# Patient Record
Sex: Male | Born: 1990 | Race: Black or African American | Hispanic: No | Marital: Single
Health system: Southern US, Community
[De-identification: ages and names within clinical notes are randomized; demographics above are authoritative.]

## PROBLEM LIST (undated history)

## (undated) DIAGNOSIS — R569 Unspecified convulsions: Secondary | ICD-10-CM

## (undated) DIAGNOSIS — W3400XA Accidental discharge from unspecified firearms or gun, initial encounter: Secondary | ICD-10-CM

## (undated) DIAGNOSIS — G43909 Migraine, unspecified, not intractable, without status migrainosus: Secondary | ICD-10-CM

---
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
## 2012-04-08 ENCOUNTER — Encounter: Payer: Medicaid Other | Admitting: Family Medicine

## 2012-07-17 ENCOUNTER — Ambulatory Visit (INDEPENDENT_AMBULATORY_CARE_PROVIDER_SITE_OTHER): Payer: Medicaid Other | Admitting: Family Medicine

## 2012-07-17 ENCOUNTER — Encounter: Payer: Self-pay | Admitting: Family Medicine

## 2012-07-17 VITALS — HR 59 | Temp 98.2°F | Ht 73.0 in | Wt 166.3 lb

## 2012-07-17 DIAGNOSIS — R569 Unspecified convulsions: Secondary | ICD-10-CM

## 2012-07-17 DIAGNOSIS — Z23 Encounter for immunization: Secondary | ICD-10-CM

## 2012-07-17 NOTE — Patient Instructions (Signed)
Human Papillomavirus HPV stands for human papillomavirus. There are many different types of HPV. People of all ages and races can get an HPV infection. In females, some types of HPV can cause warts on the sex organs or anus. Some females never see any warts on the outside, yet still have the infection inside. The doctor will need to check the sex organs and do a Pap test to see there is an HPV infection. The only sign of infection may be a Pap test that is abnormal. A male who has HPV has a higher chance of getting cancer of the sex organs or anus. It is very rare, but some females can give their babies the HPV infection when the baby is being born. Some of these babies can grow warts on the voice box (vocal cords). Males with HPV have a higher chance of getting cancer of the penis or anus. A male may have HPV but not see any warts on his penis or anus. There is no test to find HPV in males, so even if warts are not seen, there may still be an infection. HOME CARE   Take medicines as told by your doctor.  Get needed Pap tests.  Keep follow-up exams.  Do not touch or scratch the warts.  Do not treat warts with medicines used for treating hand warts.  Tell your sex partner about your infection because he or she may also need treatment.  Do not have sex while you are getting treatment.  After treatment, use condoms during sex.  Use over-the-counter creams for itching as told by your doctor.  Use over-the-counter or prescription medicines for pain, discomfort or fever as told by your doctor.  Do not douche or use tampons during treatment of HPV. GET HELP RIGHT AWAY IF:  You have a temperature by mouth above 102 F (38.9 C), not controlled by medicine. MAKE SURE YOU:   Understand these instructions.  Will watch your condition.  Will get help right away if you are not doing well or get worse. Document Released: 06/28/2008 Document Revised: 10/08/2011 Document Reviewed:  06/28/2008 [facility] Patient Information 2013 [place], [place].

## 2012-07-17 NOTE — Progress Notes (Signed)
Subjective:     Patient ID: Bruce Shields, male   DOB: 1990-09-26, 21 y.o.   MRN: [id number]  HPI Seizure D/O: Patient has a known seizure disorder since he was [age] years old. He has been followed by Dr. Benna Dunks and would like to re-establish with him. He has not seen his neurologist in over 3 years. He has already spoke with the office and they will see him again with a referral. He has been seizure free in over three years. He seems to have been non-compliant with medications over the past few years stating he was attempting to "wean" himself of them. Since May 2013 he has begun to have migraine headaches and this concerns him that he will have a seizure again. He endorses photophobia, nausea and occasionally vomit with his migraines.   TdAP: His daughter had pertussis and he was treated once for it. He is interested in receiving the Tdap today since it has been over ten years since he has had a tetanus shot. He is also interested in scheduling the HPV vaccination on his next appointment.    Review of Systems  All other systems reviewed and are negative.       Objective:   Physical Exam  Constitutional: He is oriented to person, place, and time. He appears well-developed and well-nourished. No distress.  HENT:  Head: Normocephalic and atraumatic.  Right Ear: External ear normal.  Left Ear: External ear normal.  Nose: Nose normal.  Mouth/Throat: No oropharyngeal exudate.  Eyes: Conjunctivae normal and EOM are normal. Pupils are equal, round, and reactive to light. Right eye exhibits no discharge. Left eye exhibits no discharge. No scleral icterus.  Cardiovascular: Normal rate, regular rhythm and normal heart sounds.   No murmur heard. Pulmonary/Chest: Effort normal and breath sounds normal. No respiratory distress. He has no wheezes. He has no rales.  Abdominal: Soft. Bowel sounds are normal. He exhibits no distension and no mass. There is no tenderness. There is no rebound and no  guarding.  Musculoskeletal: He exhibits no edema and no tenderness.  Neurological: He is alert and oriented to person, place, and time.  Skin: Skin is warm and dry.

## 2012-07-21 ENCOUNTER — Other Ambulatory Visit (HOSPITAL_COMMUNITY): Payer: Self-pay | Admitting: Pediatrics

## 2012-07-21 DIAGNOSIS — R569 Unspecified convulsions: Secondary | ICD-10-CM

## 2012-07-21 MED ORDER — TETANUS-DIPHTH-ACELL PERTUSSIS 5-2.5-18.5 LF-MCG/0.5 IM SUSP: 0.5000 mL | Freq: Once | INTRAMUSCULAR | Status: AC

## 2012-07-21 NOTE — Addendum Note (Signed)
Addended by: Altamese Dilling A on: 07/21/2012 04:00 PM   Modules accepted: Orders

## 2012-08-06 ENCOUNTER — Ambulatory Visit (HOSPITAL_COMMUNITY)
Admission: RE | Admit: 2012-08-06 | Discharge: 2012-08-06 | Disposition: A | Discharge status: Home / Self Care | Payer: Medicaid Other | Source: Ambulatory Visit | Attending: Pediatrics | Admitting: Pediatrics

## 2012-08-06 DIAGNOSIS — R569 Unspecified convulsions: Secondary | ICD-10-CM

## 2012-08-06 DIAGNOSIS — G40909 Epilepsy, unspecified, not intractable, without status epilepticus: Secondary | ICD-10-CM | POA: Insufficient documentation

## 2012-08-06 NOTE — Progress Notes (Signed)
EEG completed.

## 2012-08-07 NOTE — Procedures (Signed)
EEG NUMBER:  [id number].  CLINICAL HISTORY:  This is a 22 year old male with history of seizure disorder since age 74, who has been seizure free for the past 3 years. The patient has been noncompliant with his medication and would like to wean himself off of medication.  EEG was done to evaluate for seizure.  MEDICATIONS:  None at this time.  PROCEDURE:  The tracing was carried out on a 32-channel digital Cadwell recorder reformatted into 16-channel montages with 1 devoted to EKG. The 10/20 international system electrode placement was used.  Recording was done during awake and sleep.  DESCRIPTION OF FINDINGS:  During awake state, background rhythm consisted of amplitude of 38 microvolts and frequency of 10 hertz, posterior dominant rhythm.  There was normal anterior-posterior gradient noted.  Background was continuous and symmetric with no focal slowing. During drowsiness and sleep, there was gradual replacement of alpha rhythm with upper theta activity.  During earlier stage of sleep, there were a few vertex sharp waves noted, but no sleep spindles appreciated. Hyperventilation resulted in no significant slowing of the background activity.  Photic stimulation using a step-wise increase in photic frequency resulted in bilateral driving response in the lower frequency of photic stimulation.  Throughout the tracing, there were no focal or generalized epileptiform activities in the form of spikes or sharps noted.  There were no transient rhythmic activities or electrographic seizures noted.  One-lead EKG rhythm strip revealed sinus rhythm with a rate of 48 beats per minute.  IMPRESSION:  This EEG is normal during awake and sleep state.  Please note that a normal EEG does not exclude epilepsy.  Clinical correlation is indicated.  Of note, there was relative bradycardia throughout the tracing.          ______________________________             Keturah Shavers, MD    [id number] D:   08/06/2012 18:02:36  T:  08/07/2012 02:56:33  Job #:  [id number]

## 2012-08-11 ENCOUNTER — Encounter (HOSPITAL_COMMUNITY): Payer: Self-pay

## 2012-08-11 ENCOUNTER — Emergency Department (HOSPITAL_COMMUNITY)
Admission: EM | Admit: 2012-08-11 | Discharge: 2012-08-11 | Disposition: A | Discharge status: Home / Self Care | Payer: Medicaid Other | Attending: Emergency Medicine | Admitting: Emergency Medicine

## 2012-08-11 DIAGNOSIS — R05 Cough: Secondary | ICD-10-CM | POA: Insufficient documentation

## 2012-08-11 DIAGNOSIS — Z8679 Personal history of other diseases of the circulatory system: Secondary | ICD-10-CM | POA: Insufficient documentation

## 2012-08-11 DIAGNOSIS — H9209 Otalgia, unspecified ear: Secondary | ICD-10-CM | POA: Insufficient documentation

## 2012-08-11 DIAGNOSIS — J029 Acute pharyngitis, unspecified: Secondary | ICD-10-CM | POA: Insufficient documentation

## 2012-08-11 DIAGNOSIS — R059 Cough, unspecified: Secondary | ICD-10-CM | POA: Insufficient documentation

## 2012-08-11 DIAGNOSIS — IMO0001 Reserved for inherently not codable concepts without codable children: Secondary | ICD-10-CM | POA: Insufficient documentation

## 2012-08-11 DIAGNOSIS — Z8669 Personal history of other diseases of the nervous system and sense organs: Secondary | ICD-10-CM | POA: Insufficient documentation

## 2012-08-11 MED ORDER — HYDROCODONE-ACETAMINOPHEN 5-325 MG PO TABS: 2.0000 | ORAL_TABLET | ORAL | Status: DC | PRN

## 2012-08-11 MED ORDER — HYDROCODONE-ACETAMINOPHEN 5-325 MG PO TABS
2.0000 | ORAL_TABLET | Freq: Once | ORAL | Status: AC
  Administered 2012-08-11: 2 via ORAL
  Filled 2012-08-11: qty 2

## 2012-08-11 NOTE — ED Provider Notes (Signed)
History   This chart was scribed for Doug Sou, MD by Gerlean Ren, ED Scribe. This patient was seen in room TR06C/TR06C and the patient's care was started at 9:56 PM    CSN: [id number]  Arrival date & time 08/11/12  2110   First MD Initiated Contact with Patient 08/11/12 2155      Chief Complaint  Patient presents with  . Sore Throat  . Generalized Body Aches     The history is provided by the patient. No language interpreter was used.   Bruce Shields is a 22 y.o. male with no chronic medical conditions who presents to the Emergency Department complaining of 3 days of constant, gradually worsening sore throat worsened by swallowing causing decreased food and fluid intake with associated cough, right side otalgia, and myalgias.  Mucinex taken with no improvements to any symptoms.  Pt reports occasional tobacco use and denies alcohol use. Past Medical History  Diagnosis Date  . Migraine   . Seizure     History reviewed. No pertinent past surgical history.  History reviewed. No pertinent family history.  History  Substance Use Topics  . Smoking status: Never Smoker   . Smokeless tobacco: Not on file  . Alcohol Use: No      Review of Systems  Constitutional: Negative.   HENT: Positive for ear pain and sore throat.   Respiratory: Positive for cough.   Cardiovascular: Negative.   Gastrointestinal: Negative.   Musculoskeletal: Positive for myalgias.  Skin: Negative.   Neurological: Negative.   Hematological: Negative.   Psychiatric/Behavioral: Negative.     Allergies  Review of patient's allergies indicates no known allergies.  Home Medications  No current outpatient prescriptions on file.  BP 158/81  Pulse 77  Temp 98.4 F (36.9 C) (Oral)  Resp 16  SpO2 98%  Physical Exam  Nursing note and vitals reviewed. Constitutional: He appears well-developed and well-nourished.  HENT:  Head: Normocephalic and atraumatic.       Uvula midline, tonsils enlarged  and red.  Eyes: Conjunctivae normal are normal. Pupils are equal, round, and reactive to light.  Neck: Neck supple. No tracheal deviation present. No thyromegaly present.       Tender cervical lymphadenopathy.  Cardiovascular: Normal rate and regular rhythm.   No murmur heard. Pulmonary/Chest: Effort normal and breath sounds normal.  Abdominal: Soft. Bowel sounds are normal. He exhibits no distension. There is no tenderness.  Musculoskeletal: Normal range of motion. He exhibits no edema and no tenderness.  Neurological: He is alert. Coordination normal.  Skin: Skin is warm and dry. No rash noted.  Psychiatric: He has a normal mood and affect.    ED Course  Procedures (including critical care time) DIAGNOSTIC STUDIES: Oxygen Saturation is 98% on room air, normal by my interpretation.    COORDINATION OF CARE: 9:59 PM- Patient informed of clinical course, understands medical decision-making process, and agrees with plan.   Results for orders placed during the hospital encounter of 08/11/12  RAPID STREP SCREEN      Component Value Range   Streptococcus, Group A Screen (Direct) NEGATIVE  NEGATIVE     No diagnosis found.   11 PM feels improved after treatment with Norco. Patient having secretions well, in no distress MDM  Plan prescription Norco encourage oral hydration. Blood pressure recheck 3 weeks  Diagnosis #1 pharyngitis #2 elevated blood pressure I personally performed the services described in this documentation, which was scribed in my presence. The recorded information has been reviewed  and is accurate.         Doug Sou, MD 08/11/12 [phone]

## 2012-08-11 NOTE — ED Notes (Signed)
Dr. Jacubowitz at bedside 

## 2012-08-11 NOTE — ED Notes (Signed)
Pt appears to be restless and c/o generalized body pain. Pt present with red, swollen throat and white coating on tonsils. Pt mentating appropriately. Pt denies nausea/vomiting/diarrhea. Pt states dizziness and lightheadedness present.

## 2012-08-11 NOTE — ED Notes (Addendum)
Pt's throat is red, swollen, and has white pockets present. Increase swelling to (R) side

## 2012-08-11 NOTE — ED Notes (Signed)
Pt states he ha da cough earlier but took mucinex and now cough is gone.

## 2012-08-11 NOTE — ED Notes (Signed)
Pt reports throat pain, lower back pain, headache and (R) ear aches x3 days. Pt reports increase pain w/swallowing. Pt restless in triage

## 2012-08-13 ENCOUNTER — Encounter (HOSPITAL_COMMUNITY): Payer: Self-pay | Admitting: Emergency Medicine

## 2012-08-13 ENCOUNTER — Emergency Department (HOSPITAL_COMMUNITY)
Admission: EM | Admit: 2012-08-13 | Discharge: 2012-08-13 | Disposition: A | Discharge status: Home / Self Care | Payer: Medicaid Other | Attending: Emergency Medicine | Admitting: Emergency Medicine

## 2012-08-13 DIAGNOSIS — Z79899 Other long term (current) drug therapy: Secondary | ICD-10-CM | POA: Insufficient documentation

## 2012-08-13 DIAGNOSIS — J36 Peritonsillar abscess: Secondary | ICD-10-CM

## 2012-08-13 DIAGNOSIS — Z8679 Personal history of other diseases of the circulatory system: Secondary | ICD-10-CM | POA: Insufficient documentation

## 2012-08-13 DIAGNOSIS — R52 Pain, unspecified: Secondary | ICD-10-CM | POA: Insufficient documentation

## 2012-08-13 DIAGNOSIS — R05 Cough: Secondary | ICD-10-CM | POA: Insufficient documentation

## 2012-08-13 DIAGNOSIS — R059 Cough, unspecified: Secondary | ICD-10-CM | POA: Insufficient documentation

## 2012-08-13 DIAGNOSIS — H9209 Otalgia, unspecified ear: Secondary | ICD-10-CM | POA: Insufficient documentation

## 2012-08-13 DIAGNOSIS — Z8669 Personal history of other diseases of the nervous system and sense organs: Secondary | ICD-10-CM | POA: Insufficient documentation

## 2012-08-13 LAB — BASIC METABOLIC PANEL
BUN: 9 mg/dL (ref 6–23)
CO2: 24 mEq/L (ref 19–32)
Calcium: 10.4 mg/dL (ref 8.4–10.5)
Creatinine, Ser: 0.77 mg/dL (ref 0.50–1.35)

## 2012-08-13 LAB — CBC WITH DIFFERENTIAL/PLATELET
Basophils Absolute: 0 10*3/uL (ref 0.0–0.1)
Basophils Relative: 0 % (ref 0–1)
Eosinophils Relative: 0 % (ref 0–5)
HCT: 46.5 % (ref 39.0–52.0)
Lymphocytes Relative: 8 % — ABNORMAL LOW (ref 12–46)
MCHC: 35.1 g/dL (ref 30.0–36.0)
MCV: 91.2 fL (ref 78.0–100.0)
Monocytes Absolute: 2.6 10*3/uL — ABNORMAL HIGH (ref 0.1–1.0)
Monocytes Relative: 17 % — ABNORMAL HIGH (ref 3–12)
RDW: 12.3 % (ref 11.5–15.5)

## 2012-08-13 MED ORDER — SODIUM CHLORIDE 0.9 % IV SOLN
1000.0000 mL | INTRAVENOUS | Status: DC
  Administered 2012-08-13 (×2): 1000 mL via INTRAVENOUS

## 2012-08-13 MED ORDER — SODIUM CHLORIDE 0.9 % IV SOLN
1000.0000 mL | Freq: Once | INTRAVENOUS | Status: AC
  Administered 2012-08-13: 1000 mL via INTRAVENOUS

## 2012-08-13 MED ORDER — CLINDAMYCIN HCL 300 MG PO CAPS
300.0000 mg | ORAL_CAPSULE | Freq: Four times a day (QID) | ORAL | Status: DC
Start: 2012-08-13 — End: 2013-03-11

## 2012-08-13 MED ORDER — CLINDAMYCIN PHOSPHATE 600 MG/50ML IV SOLN
600.0000 mg | Freq: Once | INTRAVENOUS | Status: AC
  Administered 2012-08-13: 600 mg via INTRAVENOUS
  Filled 2012-08-13: qty 50

## 2012-08-13 MED ORDER — HYDROMORPHONE HCL PF 1 MG/ML IJ SOLN
1.0000 mg | INTRAMUSCULAR | Status: DC | PRN
  Administered 2012-08-13 (×2): 1 mg via INTRAVENOUS
  Filled 2012-08-13 (×2): qty 1

## 2012-08-13 MED ORDER — HYDROCODONE-ACETAMINOPHEN 5-325 MG PO TABS: 1.0000 | ORAL_TABLET | Freq: Four times a day (QID) | ORAL | Status: DC | PRN

## 2012-08-13 MED ORDER — SODIUM CHLORIDE 0.9 % IV SOLN: 1000.0000 mL | Freq: Once | INTRAVENOUS | Status: DC

## 2012-08-13 NOTE — ED Notes (Addendum)
Pt states that he has had sore throat, cough, and right ear pain since Sunday.  Pt is holding a wash cloth over his mouth while talking because of "drool" because it "hurts to swallow".  Pt will not remove it to talk to me.  When asked if he is having trouble breathing, pt states yes.  Pt's throat is severely swollen.

## 2012-08-13 NOTE — ED Provider Notes (Signed)
History     CSN: [id number] Arrival date & time 08/13/12  4098 First MD Initiated Contact with Patient 08/13/12 [phone]      Chief Complaint  Patient presents with  . Sore Throat  . Otalgia  . Cough   HPI The patient presents to the emergency room with complaints of a sore throat.  He states the symptoms started on Saturday. He has had some slight cough and body aches associated with a sore throat. He was initially seen in the emergency department on January 13. He had a strep test that was negative and discharged home with supportive care. The symptoms have progressed since that time. Patient is now having increasing pain in his right throat and right ear. Feels like he is having difficulty breathing, swallowing and eating because of the severe pain. The patient now states it's even hard for him to swallow his own saliva.  He denies any chest pain. He denies any recent injuries. Past Medical History  Diagnosis Date  . Migraine   . Seizure     History reviewed. No pertinent past surgical history.  History reviewed. No pertinent family history.  History  Substance Use Topics  . Smoking status: Never Smoker   . Smokeless tobacco: Not on file  . Alcohol Use: No      Review of Systems  All other systems reviewed and are negative.    Allergies  Review of patient's allergies indicates no known allergies.  Home Medications   Current Outpatient Rx  Name  Route  Sig  Dispense  Refill  . GUAIFENESIN ER 600 MG PO TB12   Oral   Take 1,200 mg by mouth 2 (two) times daily.         Marland Kitchen HYDROCODONE-ACETAMINOPHEN 5-325 MG PO TABS   Oral   Take 2 tablets by mouth every 4 (four) hours as needed for pain.   12 tablet   0     BP 127/98  Pulse 100  Temp 98.2 F (36.8 C) (Oral)  Resp 18  SpO2 99%  Physical Exam  Nursing note and vitals reviewed. Constitutional: He appears well-developed and well-nourished. No distress.  HENT:  Head: Normocephalic and atraumatic.  Right  Ear: External ear normal.  Left Ear: External ear normal.  Mouth/Throat: Mucous membranes are dry. No oral lesions. Uvula swelling present. Oropharyngeal exudate, posterior oropharyngeal edema, posterior oropharyngeal erythema and tonsillar abscesses (uvula is deviating towards the left, asymmetrical swelling on the right) present.       His voice is hoarse  Eyes: Conjunctivae normal are normal. Right eye exhibits no discharge. Left eye exhibits no discharge. No scleral icterus.  Neck: Neck supple. No tracheal deviation present.       Course voice  Cardiovascular: Normal rate, regular rhythm and intact distal pulses.   Pulmonary/Chest: Effort normal and breath sounds normal. No stridor. No respiratory distress. He has no wheezes. He has no rales.  Abdominal: Soft. Bowel sounds are normal. He exhibits no distension. There is no tenderness. There is no rebound and no guarding.  Musculoskeletal: He exhibits no edema and no tenderness.  Lymphadenopathy:    He has cervical adenopathy.  Neurological: He is alert. He has normal strength. No sensory deficit. Cranial nerve deficit:  no gross defecits noted. He exhibits normal muscle tone. He displays no seizure activity. Coordination normal.  Skin: Skin is warm and dry. No rash noted.  Psychiatric: He has a normal mood and affect.    ED Course  Procedures (including  critical care time) ED Medication Orders          Start      Status  Ordering Provider     08/13/12 0830    0.9 % sodium chloride infusion Once  Last MAR action: Stopped  Calem Cocozza R        Route: Intravenous Ordered Dose: 1,000 mL                          08/13/12 0830    0.9 % sodium chloride infusion Once  Acknowledged  Treyce Spillers R        Route: Intravenous Ordered Dose: 1,000 mL                          08/13/12 0830    0.9 % sodium chloride infusion Continuous  Last MAR action: New Bag/Given  Rifky Lapre R        Route: Intravenous Ordered Dose: 1,000 mL                           08/13/12 0830    clindamycin (CLEOCIN) IVPB 600 mg Once  Last MAR action: Stopped  Hamed Debella R        Route: Intravenous Ordered Dose: 600 mg                  08/13/12 0820    HYDROmorphone (DILAUDID) injection 1 mg Every 30 min PRN           Route: Intravenous Ordered Dose: 1 mg        Labs Reviewed  CBC WITH DIFFERENTIAL - Abnormal; Notable for the following:    WBC 15.7 (*)     Neutro Abs 11.8 (*)     Lymphocytes Relative 8 (*)     Monocytes Relative 17 (*)     Monocytes Absolute 2.6 (*)     All other components within normal limits  BASIC METABOLIC PANEL - Abnormal; Notable for the following:    Sodium 133 (*)     Chloride 95 (*)     Glucose, Bld 114 (*)     All other components within normal limits   No results found.   1. Peritonsillar abscess       MDM  Patient's exam is consistent with a peritonsillar abscess.  He was hydrated, given antibiotics, and provided pain medications in the emergency department. I consulted with Dr. Deetta Perla, ENT.  He would prefer to see the patient in his office this afternoon for treatment of the peritonsillar abscess as he is in the OR this morning.Marland Kitchen He'll be able to perform the incision and drainage there.  I spoke with the patient and his mother. He will be discharged from the emergency room to followup there.  He is comfortable with that plan        Celene Kras, MD 08/13/12 [phone]

## 2012-08-14 ENCOUNTER — Ambulatory Visit: Payer: Medicaid Other | Admitting: Family Medicine

## 2013-03-10 DIAGNOSIS — Z0289 Encounter for other administrative examinations: Secondary | ICD-10-CM

## 2013-03-11 ENCOUNTER — Encounter (HOSPITAL_COMMUNITY): Payer: Self-pay | Admitting: Emergency Medicine

## 2013-03-11 ENCOUNTER — Emergency Department (HOSPITAL_COMMUNITY)
Admission: EM | Admit: 2013-03-11 | Discharge: 2013-03-11 | Discharge status: Against Medical Advice | Payer: Medicaid Other | Attending: Emergency Medicine | Admitting: Emergency Medicine

## 2013-03-11 ENCOUNTER — Emergency Department (HOSPITAL_COMMUNITY)
Admission: EM | Admit: 2013-03-11 | Discharge: 2013-03-11 | Disposition: A | Discharge status: Hospice | Payer: Medicaid Other | Source: Home / Self Care | Attending: Emergency Medicine | Admitting: Emergency Medicine

## 2013-03-11 DIAGNOSIS — M549 Dorsalgia, unspecified: Secondary | ICD-10-CM

## 2013-03-11 DIAGNOSIS — R1013 Epigastric pain: Secondary | ICD-10-CM

## 2013-03-11 DIAGNOSIS — M545 Low back pain, unspecified: Secondary | ICD-10-CM | POA: Insufficient documentation

## 2013-03-11 DIAGNOSIS — R509 Fever, unspecified: Secondary | ICD-10-CM | POA: Insufficient documentation

## 2013-03-11 DIAGNOSIS — R109 Unspecified abdominal pain: Secondary | ICD-10-CM | POA: Insufficient documentation

## 2013-03-11 DIAGNOSIS — Z8679 Personal history of other diseases of the circulatory system: Secondary | ICD-10-CM | POA: Insufficient documentation

## 2013-03-11 LAB — CBC WITH DIFFERENTIAL/PLATELET
Eosinophils Absolute: 0 10*3/uL (ref 0.0–0.7)
HCT: 45.3 % (ref 39.0–52.0)
Hemoglobin: 16 g/dL (ref 13.0–17.0)
MCH: 32.5 pg (ref 26.0–34.0)
MCHC: 35.3 g/dL (ref 30.0–36.0)
Monocytes Absolute: 0.4 10*3/uL (ref 0.1–1.0)
Monocytes Relative: 6 % (ref 3–12)
RDW: 13.1 % (ref 11.5–15.5)

## 2013-03-11 LAB — COMPREHENSIVE METABOLIC PANEL
ALT: 24 U/L (ref 0–53)
AST: 30 U/L (ref 0–37)
Alkaline Phosphatase: 66 U/L (ref 39–117)
CO2: 26 mEq/L (ref 19–32)
Calcium: 10 mg/dL (ref 8.4–10.5)
GFR calc non Af Amer: >90 mL/min (ref >90)
Glucose, Bld: 87 mg/dL (ref 70–99)
Potassium: 3.6 mEq/L (ref 3.5–5.1)
Sodium: 135 mEq/L (ref 135–145)
Total Protein: 8.2 g/dL (ref 6.0–8.3)

## 2013-03-11 LAB — LIPASE, BLOOD: Lipase: 17 U/L (ref 11–59)

## 2013-03-11 LAB — POCT URINALYSIS DIP (DEVICE)
Bilirubin Urine: NEGATIVE
Glucose, UA: NEGATIVE mg/dL
Ketones, ur: NEGATIVE mg/dL
Leukocytes, UA: NEGATIVE
Protein, ur: NEGATIVE mg/dL
Specific Gravity, Urine: 1.02 (ref 1.005–1.030)

## 2013-03-11 MED ORDER — IBUPROFEN 800 MG PO TABS
800.0000 mg | ORAL_TABLET | Freq: Once | ORAL | Status: AC
  Administered 2013-03-11: 800 mg via ORAL

## 2013-03-11 MED ORDER — KETOROLAC TROMETHAMINE 30 MG/ML IJ SOLN
30.0000 mg | Freq: Once | INTRAMUSCULAR | Status: AC
  Administered 2013-03-11: 30 mg via INTRAMUSCULAR
  Filled 2013-03-11: qty 1

## 2013-03-11 MED ORDER — HYDROMORPHONE HCL PF 1 MG/ML IJ SOLN
1.0000 mg | Freq: Once | INTRAMUSCULAR | Status: DC
  Filled 2013-03-11: qty 1

## 2013-03-11 MED ORDER — IBUPROFEN 800 MG PO TABS
ORAL_TABLET | ORAL | Status: AC
  Filled 2013-03-11: qty 1

## 2013-03-11 NOTE — ED Notes (Signed)
Pt drove himself to hospital no ride home PA aware, dilaudid aware

## 2013-03-11 NOTE — ED Notes (Signed)
Lower back pain since last night denies any injury states that thye went to urgent care , they did urine it was neg , pain occ rads to abd

## 2013-03-11 NOTE — ED Provider Notes (Signed)
CSN: [id number]     Arrival date & time 03/11/13  0802 History     First MD Initiated Contact with Patient 03/11/13 [phone]     Chief Complaint  Patient presents with  . Back Pain  . Abdominal Pain   (Consider location/radiation/quality/duration/timing/severity/associated sxs/prior Treatment) HPI Comments: Patient presents urgent care complaining of sudden onset of back and abdominal pain that started suddenly this a.m. 1:00 . Patient describes the pain as extremely severe beyond 10 to starts in his back and radiates to his upper abdomen bilaterally. Patient is constantly pacing in the room moving, restless unable to find a comfortable position. Patient denies nausea vomiting or diarrheas but does describe that maybe he had a fever yesterday he is uncertain.  Patient denies any respiratory symptoms such as cough or shortness of breath denies any chest pain. Patient did not try to take anything for pain or for his stomach. Prior arrival.  Burgess Estelle he had a mild headache which he describes been having frequent migraine headaches.  Patient is a 22 y.o. male presenting with back pain and abdominal pain. The history is provided by the patient.  Back Pain Location:  Thoracic spine and lumbar spine Quality:  Aching Pain severity:  Severe Onset quality:  Sudden Timing:  Constant Progression:  Worsening Context: not recent injury and not twisting   Worsened by:  Movement Associated symptoms: abdominal pain and fever   Associated symptoms: no abdominal swelling, no bladder incontinence, no perianal numbness and no tingling   Risk factors: no recent surgery   Abdominal Pain Associated symptoms include abdominal pain. Pertinent negatives include no shortness of breath.    Past Medical History  Diagnosis Date  . Migraine   . Seizure    History reviewed. No pertinent past surgical history. History reviewed. No pertinent family history. History  Substance Use Topics  . Smoking status:  Never Smoker   . Smokeless tobacco: Not on file  . Alcohol Use: No    Review of Systems  Constitutional: Positive for fever and activity change. Negative for chills and diaphoresis.  Respiratory: Negative for cough and shortness of breath.   Gastrointestinal: Positive for abdominal pain. Negative for nausea, diarrhea, constipation, blood in stool and abdominal distention.  Genitourinary: Negative for bladder incontinence, flank pain and enuresis.  Musculoskeletal: Positive for back pain. Negative for arthralgias.  Skin: Negative for color change, pallor, rash and wound.  Neurological: Negative for dizziness and tingling.  Hematological: Does not bruise/bleed easily.    Allergies  Review of patient's allergies indicates no known allergies.  Home Medications   Current Outpatient Rx  Name  Route  Sig  Dispense  Refill  . clindamycin (CLEOCIN) 300 MG capsule   Oral   Take 1 capsule (300 mg total) by mouth every 6 (six) hours.   28 capsule   0   . guaiFENesin (MUCINEX) 600 MG 12 hr tablet   Oral   Take 1,200 mg by mouth 2 (two) times daily.         Marland Kitchen HYDROcodone-acetaminophen (NORCO) 5-325 MG per tablet   Oral   Take 1-2 tablets by mouth every 6 (six) hours as needed for pain.   16 tablet   0    BP 139/71  Pulse 88  Temp(Src) 98.3 F (36.8 C) (Oral)  Resp 18  SpO2 98% Physical Exam  Vitals reviewed. Constitutional: Vital signs are normal. He appears distressed.  Eyes: No scleral icterus.  Pulmonary/Chest: Effort normal and breath sounds normal.  Abdominal:  Normal appearance. He exhibits no distension.    Musculoskeletal:       Back:  Neurological: He is alert.  Skin: No rash noted. He is not diaphoretic. No erythema.    ED Course   Procedures (including critical care time)  Labs Reviewed  POCT URINALYSIS DIP (DEVICE)   No results found. 1. Back pain   2. Epigastric pain     MDM  22 year old male presents urgent care with severe back and abdominal  pain of sudden onset. Patient is restless,uncooperative on exam secondary to reported pain. Patient is hemodynamically stable, and afebrile at [facility].  The diagnostic differential is wide and patient is reporting severe abdominal pain will be transferred to the emergency department for further management and evaluation. Patient has been given 800 mg of ibuprofen.   Patient had no hematuria on a quick- urine dip analysis  Jimmie Molly, MD 03/11/13 [phone]

## 2013-03-11 NOTE — ED Notes (Signed)
C/o back pain which started last night around 1 am  Patient states pain radiating to stomach.  Denies vomiting.

## 2013-03-11 NOTE — ED Provider Notes (Signed)
CSN: [id number]     Arrival date & time 03/11/13  7829 History     First MD Initiated Contact with Patient 03/11/13 0857     Chief Complaint  Patient presents with  . Back Pain   (Consider location/radiation/quality/duration/timing/severity/associated sxs/prior Treatment) HPI Comments: Patient is a 22 year old male presenting to the emergency department from urgent care for acute onset constant stabbing epigastric abdominal pain and low back pain that awoke the patient from his sleep this morning at 1AM. Patient states his abdominal pain has resolved since receiving 800mg  Ibuprofen at [facility], but his back pain has not improved. Patient is still complaining of his back pain, w/o radiation. He rates his back pain 10/10 w/o alleviating factors. Patient states he is unable to find a comfortable position. He did not try any medication PTA. Pt reports subjective fever at home. Pt denies CP, SOB, nausea, vomiting, diarrhea, bladder or bowel incontinence.   Patient is a 22 y.o. male presenting with back pain.  Back Pain Associated symptoms: abdominal pain   Associated symptoms: no chest pain, no numbness and no weakness     Past Medical History  Diagnosis Date  . Migraine   . Seizure    History reviewed. No pertinent past surgical history. No family history on file. History  Substance Use Topics  . Smoking status: Never Smoker   . Smokeless tobacco: Not on file  . Alcohol Use: No    Review of Systems  Constitutional:       Subjective fever  HENT: Negative.   Eyes: Negative.   Respiratory: Negative.   Cardiovascular: Negative for chest pain, palpitations and leg swelling.  Gastrointestinal: Positive for abdominal pain. Negative for nausea, vomiting, diarrhea and constipation.  Endocrine: Negative.   Genitourinary: Negative.   Musculoskeletal: Positive for back pain.  Skin: Negative.   Neurological: Negative for dizziness, syncope, weakness, light-headedness and numbness.     Allergies  Review of patient's allergies indicates no known allergies.  Home Medications  No current outpatient prescriptions on file. BP 133/93  Pulse 77  Temp(Src) 97.9 F (36.6 C) (Oral)  Resp 18  Ht 6\' 1"  (1.854 m)  Wt 175 lb (79.379 kg)  BMI 23.09 kg/m2  SpO2 100% Physical Exam  Constitutional: He is oriented to person, place, and time. He appears well-developed and well-nourished. No distress.  Patient asleep upon my entrance into examination room  HENT:  Head: Normocephalic and atraumatic.  Mouth/Throat: Oropharynx is clear and moist.  Eyes: Conjunctivae and EOM are normal. Pupils are equal, round, and reactive to light.  Neck: Normal range of motion. Neck supple.  Cardiovascular: Normal rate, regular rhythm and normal heart sounds.   Pulmonary/Chest: Effort normal and breath sounds normal.  Abdominal: Soft. Bowel sounds are normal. He exhibits no distension. There is no tenderness. There is no rebound and no guarding.  Musculoskeletal: Normal range of motion. He exhibits no edema.  Neurological: He is alert and oriented to person, place, and time.  Skin: Skin is warm and dry. He is not diaphoretic.    ED Course   Procedures (including critical care time)  Labs Reviewed  COMPREHENSIVE METABOLIC PANEL  CBC WITH DIFFERENTIAL  LIPASE, BLOOD   No results found. 1. Abdominal  pain, other specified site   2. Back pain     MDM  Patient with back pain.  No neurological deficits and normal neuro exam.  Patient can walk but states is painful.  No loss of bowel or bladder control.  No concern  for cauda equina.  No fever, night sweats, weight loss, h/o cancer, IVDU.   Labs were obtained d/t severe nature of epigastric pain that brought patient to seek medical attention this morning despite improvement of pain. Abdomen S/NT/ND w/ good bowel sounds. Pt left AMA prior to receiving results of labs.   Jeannetta Ellis, PA-C 03/11/13 1551

## 2013-03-12 NOTE — ED Provider Notes (Signed)
Medical screening examination/treatment/procedure(s) were performed by non-physician practitioner and as supervising physician I was immediately available for consultation/collaboration.  Flint Melter, MD 03/12/13 [phone]

## 2013-03-13 ENCOUNTER — Emergency Department (HOSPITAL_COMMUNITY)
Admission: EM | Admit: 2013-03-13 | Discharge: 2013-03-13 | Disposition: A | Discharge status: Home / Self Care | Payer: Medicaid Other | Attending: Emergency Medicine | Admitting: Emergency Medicine

## 2013-03-13 ENCOUNTER — Encounter (HOSPITAL_COMMUNITY): Payer: Self-pay | Admitting: Emergency Medicine

## 2013-03-13 DIAGNOSIS — Z8679 Personal history of other diseases of the circulatory system: Secondary | ICD-10-CM | POA: Insufficient documentation

## 2013-03-13 DIAGNOSIS — Z8669 Personal history of other diseases of the nervous system and sense organs: Secondary | ICD-10-CM | POA: Insufficient documentation

## 2013-03-13 DIAGNOSIS — R51 Headache: Secondary | ICD-10-CM | POA: Insufficient documentation

## 2013-03-13 DIAGNOSIS — M549 Dorsalgia, unspecified: Secondary | ICD-10-CM | POA: Insufficient documentation

## 2013-03-13 DIAGNOSIS — R509 Fever, unspecified: Secondary | ICD-10-CM | POA: Insufficient documentation

## 2013-03-13 DIAGNOSIS — R109 Unspecified abdominal pain: Secondary | ICD-10-CM | POA: Insufficient documentation

## 2013-03-13 LAB — COMPREHENSIVE METABOLIC PANEL
Alkaline Phosphatase: 52 U/L (ref 39–117)
BUN: 8 mg/dL (ref 6–23)
CO2: 28 mEq/L (ref 19–32)
Chloride: 100 mEq/L (ref 96–112)
Creatinine, Ser: 0.88 mg/dL (ref 0.50–1.35)
GFR calc non Af Amer: >90 mL/min (ref >90)
Potassium: 4.4 mEq/L (ref 3.5–5.1)
Total Bilirubin: 0.4 mg/dL (ref 0.3–1.2)

## 2013-03-13 LAB — CBC WITH DIFFERENTIAL/PLATELET
HCT: 45.3 % (ref 39.0–52.0)
Hemoglobin: 16 g/dL (ref 13.0–17.0)
Lymphocytes Relative: 17 % (ref 12–46)
Lymphs Abs: 1.3 10*3/uL (ref 0.7–4.0)
Monocytes Absolute: 1.2 10*3/uL — ABNORMAL HIGH (ref 0.1–1.0)
Monocytes Relative: 16 % — ABNORMAL HIGH (ref 3–12)
Neutro Abs: 4.8 10*3/uL (ref 1.7–7.7)
Neutrophils Relative %: 66 % (ref 43–77)
RBC: 4.99 MIL/uL (ref 4.22–5.81)
WBC: 7.3 10*3/uL (ref 4.0–10.5)

## 2013-03-13 LAB — LIPASE, BLOOD: Lipase: 20 U/L (ref 11–59)

## 2013-03-13 LAB — URINALYSIS, ROUTINE W REFLEX MICROSCOPIC
Bilirubin Urine: NEGATIVE
Glucose, UA: NEGATIVE mg/dL
Ketones, ur: NEGATIVE mg/dL
Leukocytes, UA: NEGATIVE
Protein, ur: NEGATIVE mg/dL
pH: 6 (ref 5.0–8.0)

## 2013-03-13 MED ORDER — SODIUM CHLORIDE 0.9 % IV BOLUS (SEPSIS)
1000.0000 mL | Freq: Once | INTRAVENOUS | Status: AC
  Administered 2013-03-13: 1000 mL via INTRAVENOUS

## 2013-03-13 MED ORDER — IBUPROFEN 800 MG PO TABS: 800.0000 mg | ORAL_TABLET | Freq: Three times a day (TID) | ORAL | Status: DC

## 2013-03-13 NOTE — ED Notes (Signed)
Pt dc'd home w/all belongings, alert and ambulatory upon dc, pt verbalizes understanding of d/c instructions, 1 new rx prescribed, pt drove self home no narcotics given in ED

## 2013-03-13 NOTE — ED Provider Notes (Signed)
CSN: [id number]     Arrival date & time 03/13/13  1243 History     First MD Initiated Contact with Patient 03/13/13 1303     Chief Complaint  Patient presents with  . Abdominal Pain   (Consider location/radiation/quality/duration/timing/severity/associated sxs/prior Treatment) HPI Comments: Patient is a 22 year old male who presents today with headache, back pain, abdominal pain since Tuesday. He was seen in the emergency department at that time and was given a medication that he is on sure of the name of which helped his symptoms. On chart review that medication was ibuprofen and Toradol. The symptoms came back on Tuesday evening. His head is a frontal, throbbing headache without radiation. He is having a low back pain that is worse with laying down. He feels as though "someone hit him in the back with a bat". He also complains of abdominal pain, which is the least severe while his pain. It is a sharp epigastric abdominal pain. This pain is improving. He reports that he is currently hungry without nausea, vomiting, diarrhea. His last bowel movement was 2 or 3 days ago. He denies any new rashes. No bowel or bladder incontinence, hx of ca, or IVDA. The patient does not want any pain medication in the emergency department.   Patient is a 22 y.o. male presenting with abdominal pain. The history is provided by the patient. No language interpreter was used.  Abdominal Pain Associated symptoms: chills and fever (subjective)   Associated symptoms: no chest pain, no diarrhea, no nausea, no shortness of breath and no vomiting     Past Medical History  Diagnosis Date  . Migraine   . Seizure    History reviewed. No pertinent past surgical history. No family history on file. History  Substance Use Topics  . Smoking status: Never Smoker   . Smokeless tobacco: Not on file  . Alcohol Use: No    Review of Systems  Constitutional: Positive for fever (subjective) and chills.  Respiratory: Negative  for shortness of breath.   Cardiovascular: Negative for chest pain.  Gastrointestinal: Positive for abdominal pain. Negative for nausea, vomiting and diarrhea.  Skin: Negative for rash.  Neurological: Positive for headaches.  All other systems reviewed and are negative.    Allergies  Review of patient's allergies indicates no known allergies.  Home Medications  No current outpatient prescriptions on file. BP 135/65  Pulse 63  Temp(Src) 98 F (36.7 C)  Resp 16  SpO2 100% Physical Exam  Nursing note and vitals reviewed. Constitutional: He is oriented to person, place, and time. He appears well-developed and well-nourished.  Non-toxic appearance. He does not have a sickly appearance. He does not appear ill. No distress.  Laying comfortably in bed.   HENT:  Head: Normocephalic and atraumatic.  Right Ear: External ear normal.  Left Ear: External ear normal.  Nose: Nose normal.  No temporal artery tenderness.   Eyes: Conjunctivae are normal.  Neck: Normal range of motion. No tracheal deviation present.  No nuchal rigidity or meningeal signs  Cardiovascular: Normal rate, regular rhythm, normal heart sounds, intact distal pulses and normal pulses.   Pulmonary/Chest: Effort normal and breath sounds normal. No stridor.  Abdominal: Soft. He exhibits no distension. There is no tenderness.  Musculoskeletal: Normal range of motion.       Back:  Neurological: He is alert and oriented to person, place, and time. He has normal strength. No sensory deficit. Coordination and gait normal.  Skin: Skin is warm and dry. He is  not diaphoretic.  Psychiatric: He has a normal mood and affect. His behavior is normal.    ED Course   Procedures (including critical care time)  Labs Reviewed  CBC WITH DIFFERENTIAL - Abnormal; Notable for the following:    Monocytes Relative 16 (*)    Monocytes Absolute 1.2 (*)    All other components within normal limits  COMPREHENSIVE METABOLIC PANEL -  Abnormal; Notable for the following:    AST 46 (*)    All other components within normal limits  LIPASE, BLOOD  URINALYSIS, ROUTINE W REFLEX MICROSCOPIC   No results found. 1. Headache   2. Back pain   3. Abdominal pain     MDM  Patient is nontoxic, nonseptic appearing, in no apparent distress.  Patient's pain and other symptoms adequately managed in emergency department.  Fluid bolus given.  Labs and vitals reviewed.  Patient does not meet the SIRS or Sepsis criteria.  On repeat exam patient does not have a surgical abdomen and there are no peritoneal signs.  No indication of appendicitis, bowel obstruction, bowel perforation, cholecystitis, diverticulitis.  Patient discharged home with symptomatic treatment and given strict instructions for follow-up with their primary care physician.  Patient's HA is a frontal HA and presentation is non concerning for [facility], ICH, Meningitis, or temporal arteritis. Pt is afebrile with no focal neuro deficits, nuchal rigidity, or change in vision. I have discussed reasons to return immediately to the ER.  Patient expresses understanding and agrees with plan. I discussed this case with Dr. Fayrene Fearing who agrees with plan. Patient / Family / Caregiver informed of clinical course, understand medical decision-making process, and agree with plan.  Mora Bellman, PA-C 03/13/13 [phone]

## 2013-03-13 NOTE — ED Notes (Signed)
Pt c/o lower abd pain radiating out toward bilateral flank and lower back, frontal headache, sensitivity to light and noise, fever, and chills x3 days. Pt denies N/V/D or problems with urination

## 2013-03-13 NOTE — ED Notes (Signed)
Aching all over stomach head and back pain  x 4 days

## 2013-03-14 NOTE — ED Provider Notes (Addendum)
Medical screening examination/treatment/procedure(s) were performed by non-physician practitioner and as supervising physician I was immediately available for consultation/collaboration.    Claudean Kinds, MD 03/14/13 1106  Roney Marion, MD 04/18/13 [phone]

## 2013-04-01 ENCOUNTER — Encounter: Payer: Self-pay | Admitting: Family Medicine

## 2013-04-01 ENCOUNTER — Ambulatory Visit (INDEPENDENT_AMBULATORY_CARE_PROVIDER_SITE_OTHER): Payer: Medicaid Other | Admitting: Family Medicine

## 2013-04-01 ENCOUNTER — Other Ambulatory Visit (HOSPITAL_COMMUNITY)
Admission: RE | Admit: 2013-04-01 | Discharge: 2013-04-01 | Disposition: A | Discharge status: Home / Self Care | Payer: Medicaid Other | Source: Ambulatory Visit | Attending: Family Medicine | Admitting: Family Medicine

## 2013-04-01 VITALS — BP 139/84 | HR 63 | Temp 98.0°F | Ht 73.0 in | Wt 166.0 lb

## 2013-04-01 DIAGNOSIS — Z2089 Contact with and (suspected) exposure to other communicable diseases: Secondary | ICD-10-CM

## 2013-04-01 DIAGNOSIS — Z202 Contact with and (suspected) exposure to infections with a predominantly sexual mode of transmission: Secondary | ICD-10-CM | POA: Insufficient documentation

## 2013-04-01 DIAGNOSIS — Z113 Encounter for screening for infections with a predominantly sexual mode of transmission: Secondary | ICD-10-CM | POA: Insufficient documentation

## 2013-04-01 MED ORDER — AZITHROMYCIN 500 MG PO TABS
1000.0000 mg | ORAL_TABLET | Freq: Once | ORAL | Status: AC
  Administered 2013-04-01: 1000 mg via ORAL

## 2013-04-01 NOTE — Patient Instructions (Addendum)
We will send your urine for testing. We should have this back on Friday, I will call you with the results if it is positive. We have treated you anyway since you have a known exposure. This will likely help your leg as well.  Please come back if you start feeling different.  Bruce Shields M. Tank Difiore, M.D.

## 2013-04-01 NOTE — Assessment & Plan Note (Signed)
Partner diagnosed with chlamydia last week. They have had unprotected intercourse. Will check for GC/Ch, but given prophylactic treatment with Azithro 1g PO in clinic today. Will call if results are positive.

## 2013-04-01 NOTE — Progress Notes (Signed)
Patient ID: AUDIEL SCHEIBER, male   DOB: 1991-04-13, 22 y.o.   MRN: [id number]  [facility] Malaney Mcbean M. Madoc Holquin, MD Phone: [phone]   Subjective: HPI: Patient is a 22 y.o. male presenting to clinic today for STD exposure.  Partner diagnosed with chlamydia last week, unsure if she has been treated. No other recent partners. No discharge, no dysuria. He reports a boil on leg as well. No fevers. Feels well.  History Reviewed: No tobacco use Health Maintenance: UTD  ROS: Please see HPI above.  Objective: Office vital signs reviewed. BP 139/84  Pulse 63  Temp(Src) 98 F (36.7 C) (Oral)  Ht 6\' 1"  (1.854 m)  Wt 166 lb (75.297 kg)  BMI 21.91 kg/m2  Physical Examination:  General: Awake, alert. NAD.  Pulm: CTAB, no wheezes Cardio: RRR, no murmurs appreciated Abdomen:+BS, soft, nontender, nondistended Extremities: No edema. One firm area under left glut without erythema Neuro: Grossly intact  Assessment: 22 y.o. male with STD exposure  Plan: See Problem List and After Visit Summary

## 2013-04-03 ENCOUNTER — Telehealth: Payer: Self-pay | Admitting: Family Medicine

## 2013-04-03 NOTE — Telephone Encounter (Signed)
Informed on positive chlamydia. Adequately treated in clinic.  Porfirio Bollier M. Aries Townley, M.D.

## 2013-07-15 ENCOUNTER — Encounter: Payer: Self-pay | Admitting: Family Medicine

## 2013-07-15 ENCOUNTER — Ambulatory Visit (INDEPENDENT_AMBULATORY_CARE_PROVIDER_SITE_OTHER): Payer: Medicaid Other | Admitting: Family Medicine

## 2013-07-15 VITALS — BP 137/80 | HR 53 | Temp 98.0°F | Ht 73.0 in | Wt 178.6 lb

## 2013-07-15 DIAGNOSIS — Z202 Contact with and (suspected) exposure to infections with a predominantly sexual mode of transmission: Secondary | ICD-10-CM

## 2013-07-15 DIAGNOSIS — L509 Urticaria, unspecified: Secondary | ICD-10-CM

## 2013-07-15 DIAGNOSIS — L508 Other urticaria: Secondary | ICD-10-CM

## 2013-07-15 MED ORDER — FEXOFENADINE HCL 180 MG PO TABS: 180.0000 mg | ORAL_TABLET | Freq: Every day | ORAL | Status: DC

## 2013-07-15 NOTE — Assessment & Plan Note (Signed)
Patient voices concern for Ct testing.  Urine G/C today, will call with results.

## 2013-07-15 NOTE — Assessment & Plan Note (Addendum)
Unknown cause hives. Appears to be something that is in his bed. Possible due to  the air mattress that he has been sleeping on for last few months. Educated on etiology of hives and watching detergents, soaps etc. Prescribe daily Allegra today. Red flags given and patient was instructed to go ED if he experiences shortness of breath with hives around to face or mouth.  Followup as needed

## 2013-07-15 NOTE — Progress Notes (Signed)
   Subjective:    Patient ID: Bruce Shields, male    DOB: 11/21/1990, 22 y.o.   MRN: [id number]  HPI  Skin rash: Patient reports red raised areas over his arms and hips, and sometimes over his back, for the past 2-3 weeks. He reports they are itchy. He states they are more often occurring at night when he's in bed. He has just moved into a new apartment and is sleeping on air mattress. He is sharing a washer and dryer with the apartment complex. He has been switching back and forth with detergent with whatever is cheaper. Otherwise he has not changed any soaps etc. he states that the raised red areas come and go throughout the night. He has been experiencing some increased anxiety over the past few months. He has not taking any medications to help prevent hives.   Concern for STD: Patient reports that he has had different sexual partners, but mostly uses condoms. He does have concern that he may have chlamydia at him because he's had it before and didn't know it. He states he has no current symptoms no pain, lesions, sores or no discharge.  Review of Systems Negative, with the exception of above mentioned in HPI  Objective:   Physical Exam BP 137/80  Pulse 53  Temp(Src) 98 F (36.7 C) (Oral)  Ht 6\' 1"  (1.854 m)  Wt 178 lb 9.6 oz (81.012 kg)  BMI 23.57 kg/m2 Gen: NAD. Pleasant and cooperative CV: RRR  Chest: CTAB, no wheeze or crackles Skin: No rashes, purpura or petechiae currently

## 2013-07-15 NOTE — Patient Instructions (Signed)
Hives Hives are itchy, red, swollen areas of the skin. They can vary in size and location on your body. Hives can come and go for hours or several days (acute hives) or for several weeks (chronic hives). Hives do not spread from person to person (noncontagious). They may get worse with scratching, exercise, and emotional stress. CAUSES   Allergic reaction to food, additives, or drugs.  Infections, including the common cold.  Illness, such as vasculitis, lupus, or thyroid disease.  Exposure to sunlight, heat, or cold.  Exercise.  Stress.  Contact with chemicals. SYMPTOMS   Red or white swollen patches on the skin. The patches may change size, shape, and location quickly and repeatedly.  Itching.  Swelling of the hands, feet, and face. This may occur if hives develop deeper in the skin. DIAGNOSIS  Your caregiver can usually tell what is wrong by performing a physical exam. Skin or blood tests may also be done to determine the cause of your hives. In some cases, the cause cannot be determined. TREATMENT  Mild cases usually get better with medicines such as antihistamines. Severe cases may require an emergency epinephrine injection. If the cause of your hives is known, treatment includes avoiding that trigger.  HOME CARE INSTRUCTIONS   Avoid causes that trigger your hives.  Take antihistamines as directed by your caregiver to reduce the severity of your hives. Non-sedating or low-sedating antihistamines are usually recommended. Do not drive while taking an antihistamine.  Take any other medicines prescribed for itching as directed by your caregiver.  Wear loose-fitting clothing.  Keep all follow-up appointments as directed by your caregiver. SEEK MEDICAL CARE IF:   You have persistent or severe itching that is not relieved with medicine.  You have painful or swollen joints. SEEK IMMEDIATE MEDICAL CARE IF:   You have a fever.  Your tongue or lips are swollen.  You have  trouble breathing or swallowing.  You feel tightness in the throat or chest.  You have abdominal pain. These problems may be the first sign of a life-threatening allergic reaction. Call your local emergency services (911 in [place] MAKE SURE YOU:   Understand these instructions.  Will watch your condition.  Will get help right away if you are not doing well or get worse. Document Released: 07/16/2005 Document Revised: 01/15/2012 Document Reviewed: 10/09/2011 [facility] Patient Information 2014 [place], [place].   Please take Allegra nightly before going to bed. Again watch all types of detergents, soaps etc. If hives appear on face around mouth or in mouth, you experience shortness of breath please seek emergency help in the ED.

## 2013-07-31 ENCOUNTER — Emergency Department (HOSPITAL_COMMUNITY): Payer: Medicaid Other

## 2013-07-31 ENCOUNTER — Emergency Department (HOSPITAL_COMMUNITY)
Admission: EM | Admit: 2013-07-31 | Discharge: 2013-07-31 | Discharge status: Against Medical Advice | Payer: Medicaid Other | Attending: Emergency Medicine | Admitting: Emergency Medicine

## 2013-07-31 ENCOUNTER — Encounter (HOSPITAL_COMMUNITY): Payer: Self-pay | Admitting: Emergency Medicine

## 2013-07-31 DIAGNOSIS — B349 Viral infection, unspecified: Secondary | ICD-10-CM

## 2013-07-31 DIAGNOSIS — IMO0001 Reserved for inherently not codable concepts without codable children: Secondary | ICD-10-CM | POA: Insufficient documentation

## 2013-07-31 DIAGNOSIS — B9789 Other viral agents as the cause of diseases classified elsewhere: Secondary | ICD-10-CM | POA: Insufficient documentation

## 2013-07-31 DIAGNOSIS — Z8669 Personal history of other diseases of the nervous system and sense organs: Secondary | ICD-10-CM | POA: Insufficient documentation

## 2013-07-31 DIAGNOSIS — J069 Acute upper respiratory infection, unspecified: Secondary | ICD-10-CM

## 2013-07-31 DIAGNOSIS — R109 Unspecified abdominal pain: Secondary | ICD-10-CM | POA: Insufficient documentation

## 2013-07-31 NOTE — ED Notes (Signed)
Pt reports congestion and cough. Denies fever.

## 2013-07-31 NOTE — ED Provider Notes (Signed)
CSN: [id number]     Arrival date & time 07/31/13  1241 History  This chart was scribed for non-physician practitioner Melah Ebling PA-Cworking with Carmin Muskrat, MD by Mercy Moore, ED Scribe. This patient was seen in room TR10C/TR10C and the patient's care was started at 3:59 AM.    Chief Complaint  Patient presents with  . Nasal Congestion  . Cough    The history is provided by the patient. No language interpreter was used.   HPI Comments: Bruce Shields is a 23 y.o. male who presents to the Emergency Department complaining of flu like symptoms, onset last Friday. Patient reports a productive (yellowish sputum) cough, generalized myalgia, sore throat, painful swallowing, and facial pressure at night, eye tearing, chills abdominal pain. Denies drainage from ears. Treating symptoms with OTC medications. Producing urine normally. Patient reports sick contacts. He did not receive flu vaccination.  Denies tobacco use.    Past Medical History  Diagnosis Date  . Migraine   . Seizure    History reviewed. No pertinent past surgical history. No family history on file. History  Substance Use Topics  . Smoking status: Never Smoker   . Smokeless tobacco: Not on file  . Alcohol Use: No    Review of Systems  Constitutional: Positive for chills. Negative for fever.  HENT: Positive for congestion and sore throat. Negative for trouble swallowing.   Eyes: Negative for pain and visual disturbance.  Respiratory: Positive for cough. Negative for chest tightness and shortness of breath.   Cardiovascular: Negative for chest pain.  Neurological: Negative for weakness and headaches.  All other systems reviewed and are negative.    Allergies  Review of patient's allergies indicates no known allergies.  Home Medications   Current Outpatient Rx  Name  Route  Sig  Dispense  Refill  . OVER THE COUNTER MEDICATION   Oral   Take 2 capsules by mouth every 6 (six) hours as needed. Day and  night cold medicine          Triage Vitals: BP 158/79  Pulse 78  Temp(Src) 97.3 F (36.3 C) (Oral)  Resp 20  Wt 176 lb 3 oz (79.918 kg)  SpO2 100% Physical Exam  Nursing note and vitals reviewed. Constitutional: He is oriented to person, place, and time. He appears well-developed and well-nourished. No distress.  HENT:  Head: Normocephalic and atraumatic.  Right Ear: External ear normal.  Left Ear: External ear normal.  Mouth/Throat: Oropharynx is clear and moist. No oropharyngeal exudate.  Negative swelling, erythema, inflammation, lesions, sores noted to the posterior oropharynx and bilateral tonsils. Negative uvula swelling. Uvula midline, symmetrical elevation. Negative trismus.   Neck: Normal range of motion. Neck supple. No tracheal deviation present.  Negative neck stiffness Negative nuchal rigidity Negative cervical LAD Negative meningeal signs  Cardiovascular: Normal rate, regular rhythm and normal heart sounds.  Exam reveals no friction rub.   No murmur heard. Pulmonary/Chest: Effort normal and breath sounds normal. No respiratory distress. He has no wheezes. He has no rales.  Negative use of accessory muscles Negative respiratory distress noted Patient able to speak in full sentences without difficulty  Musculoskeletal: Normal range of motion.  Lymphadenopathy:    He has no cervical adenopathy.  Neurological: He is alert and oriented to person, place, and time. No cranial nerve deficit. He exhibits normal muscle tone. Coordination normal.  Skin: Skin is warm and dry. No rash noted. He is not diaphoretic. No erythema.  Psychiatric: He has a normal mood  and affect. His behavior is normal. Thought content normal.    ED Course  Procedures (including critical care time) DIAGNOSTIC STUDIES: Oxygen Saturation is 100% on room air, normal by my interpretation.    COORDINATION OF CARE: 3:59 AM- Pt advised of plan for treatment and pt agrees.  Dg Chest 2  View  07/31/2013   CLINICAL DATA:  23 year old male with cough and congestion.  EXAM: CHEST  2 VIEW  COMPARISON:  None.  FINDINGS: The cardiomediastinal silhouette is unremarkable.  Lungs are clear.  There is no evidence of focal airspace disease, pulmonary edema, suspicious pulmonary nodule/mass, pleural effusion, or pneumothorax.  No acute bony abnormalities are identified.  IMPRESSION: No active cardiopulmonary disease.   Electronically Signed   By: Hassan Rowan M.D.   On: 07/31/2013 15:57    Labs Review Labs Reviewed - No data to display Imaging Review Dg Chest 2 View  07/31/2013   CLINICAL DATA:  23 year old male with cough and congestion.  EXAM: CHEST  2 VIEW  COMPARISON:  None.  FINDINGS: The cardiomediastinal silhouette is unremarkable.  Lungs are clear.  There is no evidence of focal airspace disease, pulmonary edema, suspicious pulmonary nodule/mass, pleural effusion, or pneumothorax.  No acute bony abnormalities are identified.  IMPRESSION: No active cardiopulmonary disease.   Electronically Signed   By: Hassan Rowan M.D.   On: 07/31/2013 15:57    EKG Interpretation   None       MDM   1. URI, acute   2. Viral illness     Filed Vitals:   07/31/13 1246  BP: 158/79  Pulse: 78  Temp: 97.3 F (36.3 C)  Resp: 20   I personally performed the services described in this documentation, which was scribed in my presence. The recorded information has been reviewed and is accurate.  Patient presenting to emergency department with cough, nasal congestion, facial pressure, chills, bodyaches that her generalized the been ongoing since Friday. Denies vaccine. Reports using over-the-counter medication with minimal relief. Alert and oriented. GCS 15. Heart rate and rhythm normal. Pulses palpable and strong, radial 2+ bilaterally. Lungs clear to auscultation to upper lower lobes. Negative uvula swelling. Unremarkable oral exam. Mild discomfort upon palpation to maxillary and frontal sinuses. Negative  neck stiffness, negative meningeal signs. Cap refill less than 3 seconds. Full range of motion to upper lower extremities. Chest x-ray negative for acute cardiac pulmonary disease. 4:06 PM This provider went to go speak with patient regarding findings on the chest x-ray. Patient was not in room. Patient did not notify staff that he was leaving. Patient left AMA without letting staff know. Patient left AMA without results or letting staff know.      Jamse Mead, PA-C 08/01/13 0401

## 2013-07-31 NOTE — ED Notes (Signed)
Pt left without being discharged given discharge instructions.

## 2013-08-02 NOTE — ED Provider Notes (Signed)
  I personally performed the services described in this documentation, which was scribed in my presence. The recorded information has been reviewed and is accurate.     Imre Vecchione, MD 08/02/13 0002 

## 2013-12-07 ENCOUNTER — Encounter (HOSPITAL_COMMUNITY): Payer: Self-pay | Admitting: Emergency Medicine

## 2013-12-07 ENCOUNTER — Emergency Department (HOSPITAL_COMMUNITY)
Admission: EM | Admit: 2013-12-07 | Discharge: 2013-12-07 | Disposition: A | Discharge status: Home / Self Care | Payer: No Typology Code available for payment source | Attending: Emergency Medicine | Admitting: Emergency Medicine

## 2013-12-07 ENCOUNTER — Emergency Department (HOSPITAL_COMMUNITY): Payer: No Typology Code available for payment source

## 2013-12-07 DIAGNOSIS — M25531 Pain in right wrist: Secondary | ICD-10-CM

## 2013-12-07 DIAGNOSIS — Y9389 Activity, other specified: Secondary | ICD-10-CM | POA: Insufficient documentation

## 2013-12-07 DIAGNOSIS — M545 Low back pain, unspecified: Secondary | ICD-10-CM

## 2013-12-07 DIAGNOSIS — S6990XA Unspecified injury of unspecified wrist, hand and finger(s), initial encounter: Secondary | ICD-10-CM

## 2013-12-07 DIAGNOSIS — S59909A Unspecified injury of unspecified elbow, initial encounter: Secondary | ICD-10-CM | POA: Insufficient documentation

## 2013-12-07 DIAGNOSIS — IMO0002 Reserved for concepts with insufficient information to code with codable children: Secondary | ICD-10-CM | POA: Insufficient documentation

## 2013-12-07 DIAGNOSIS — Z8669 Personal history of other diseases of the nervous system and sense organs: Secondary | ICD-10-CM | POA: Insufficient documentation

## 2013-12-07 DIAGNOSIS — S59919A Unspecified injury of unspecified forearm, initial encounter: Secondary | ICD-10-CM

## 2013-12-07 DIAGNOSIS — Z8679 Personal history of other diseases of the circulatory system: Secondary | ICD-10-CM | POA: Insufficient documentation

## 2013-12-07 DIAGNOSIS — Y92410 Unspecified street and highway as the place of occurrence of the external cause: Secondary | ICD-10-CM | POA: Insufficient documentation

## 2013-12-07 MED ORDER — METHOCARBAMOL 500 MG PO TABS: 500.0000 mg | ORAL_TABLET | Freq: Two times a day (BID) | ORAL | Status: DC

## 2013-12-07 MED ORDER — NAPROXEN 500 MG PO TABS: 500.0000 mg | ORAL_TABLET | Freq: Two times a day (BID) | ORAL | Status: DC
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
## 2013-12-07 NOTE — ED Provider Notes (Signed)
Medical screening examination/treatment/procedure(s) were performed by non-physician practitioner and as supervising physician I was immediately available for consultation/collaboration.   Kathalene Frames, MD 12/07/13 [phone]
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
## 2014-01-06 ENCOUNTER — Encounter: Payer: Self-pay | Admitting: Family Medicine

## 2014-01-06 ENCOUNTER — Ambulatory Visit (INDEPENDENT_AMBULATORY_CARE_PROVIDER_SITE_OTHER): Payer: Medicaid Other | Admitting: Family Medicine

## 2014-01-06 VITALS — BP 138/52 | HR 70 | Temp 98.2°F | Ht 73.0 in | Wt 169.0 lb

## 2014-01-06 DIAGNOSIS — M549 Dorsalgia, unspecified: Secondary | ICD-10-CM

## 2014-01-06 DIAGNOSIS — M79609 Pain in unspecified limb: Secondary | ICD-10-CM | POA: Diagnosis not present

## 2014-01-06 DIAGNOSIS — M79644 Pain in right finger(s): Secondary | ICD-10-CM | POA: Insufficient documentation

## 2014-01-06 MED ORDER — NAPROXEN 500 MG PO TABS: 500.0000 mg | ORAL_TABLET | Freq: Two times a day (BID) | ORAL | Status: DC

## 2014-01-06 NOTE — Assessment & Plan Note (Addendum)
Chronic left-sided back pain appears to be muscle skeletal in nature. Patient likely could benefit from some PT. Please PT referral today. Refilled Robaxin and naproxen.
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
## 2014-01-06 NOTE — Patient Instructions (Signed)
De Quervain's Disease De Quervain's disease is a condition often seen in racquet sports where there is a soreness (inflammation) in the cord like structures (tendons) which attach muscle to bone on the thumb side of the wrist. There may be a tightening of the tissuesaround the tendons. This condition is often helped by giving up or modifying the activity which caused it. When conservative treatment does not help, surgery may be required. Conservative treatment could include changes in the activity which brought about the problem or made it worse. Anti-inflammatory medications and injections may be used to help decrease the inflammation and help with pain control. Your caregiver will help you determine which is best for you. DIAGNOSIS  Often the diagnosis (learning what is wrong) can be made by examination. Sometimes x-rays are required. HOME CARE INSTRUCTIONS   Apply ice to the sore area for 15-20 minutes, 03-04 times per day while awake. Put the ice in a plastic bag and place a towel between the bag of ice and your skin. This is especially helpful if it can be done after all activities involving the sore wrist.  Temporary splinting may help.  Only take over-the-counter or prescription medicines for pain, discomfort or fever as directed by your caregiver. SEEK MEDICAL CARE IF:   Pain relief is not obtained with medications, or if you have increasing pain and seem to be getting worse rather than better. MAKE SURE YOU:   Understand these instructions.  Will watch your condition.  Will get help right away if you are not doing well or get worse. Document Released: 04/10/2001 Document Revised: 10/08/2011 Document Reviewed: 07/16/2005 [employer] Patient Information 2014 [employer].  

## 2014-01-06 NOTE — Assessment & Plan Note (Signed)
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 Original x-ray from ED reviewed and is normal. Will send to orthopedic referral for further management.

## 2014-01-11 ENCOUNTER — Telehealth: Payer: Self-pay | Admitting: *Deleted
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
## 2014-01-13 ENCOUNTER — Ambulatory Visit: Payer: Medicaid Other

## 2014-02-26 ENCOUNTER — Ambulatory Visit: Payer: No Typology Code available for payment source | Attending: Family Medicine | Admitting: Physical Therapy

## 2014-04-03 DIAGNOSIS — Z0289 Encounter for other administrative examinations: Secondary | ICD-10-CM

## 2014-09-24 ENCOUNTER — Ambulatory Visit: Payer: Medicaid Other | Admitting: Family Medicine

## 2014-10-12 ENCOUNTER — Ambulatory Visit: Payer: Medicaid Other | Admitting: Family Medicine

## 2015-05-02 ENCOUNTER — Encounter (HOSPITAL_COMMUNITY): Payer: Self-pay

## 2015-05-02 ENCOUNTER — Emergency Department (HOSPITAL_COMMUNITY)
Admission: EM | Admit: 2015-05-02 | Discharge: 2015-05-02 | Disposition: A | Discharge status: Home / Self Care | Payer: Medicaid Other | Attending: Emergency Medicine | Admitting: Emergency Medicine

## 2015-05-02 DIAGNOSIS — H53149 Visual discomfort, unspecified: Secondary | ICD-10-CM | POA: Diagnosis not present

## 2015-05-02 DIAGNOSIS — R51 Headache: Secondary | ICD-10-CM | POA: Diagnosis not present

## 2015-05-02 DIAGNOSIS — Z8679 Personal history of other diseases of the circulatory system: Secondary | ICD-10-CM | POA: Diagnosis not present

## 2015-05-02 DIAGNOSIS — R519 Headache, unspecified: Secondary | ICD-10-CM

## 2015-05-02 NOTE — ED Notes (Signed)
MD at bedside. 

## 2015-05-02 NOTE — ED Notes (Addendum)
Per [facility]- Sudden onset 3 hours ago of generalized headache. Denies visual changes. No Neuro deficits. NEG STROKE. Denies N/V/D and fever. Toradol 30mg  IVP given in route.

## 2015-05-02 NOTE — Discharge Instructions (Signed)
Migraine Headache You were seen for your headache today that has been recurrent.  You need to follow up outpatient with your family physician and neurology.  It seems that this episode was brought on by dehydration.  Make sure you drink lots of water and stay well hydrated.  Return if you develop recurrent vomiting, fever, chills, neck stiffness, start noticing you are waking up with headaches in the morning and vomiting, or if you develop any other new concerning symptoms.  Try using Ibuprofen at home if the headache returns. A migraine headache is an intense, throbbing pain on one or both sides of your head. A migraine can last for 30 minutes to several hours. CAUSES  The exact cause of a migraine headache is not always known. However, a migraine may be caused when nerves in the brain become irritated and release chemicals that cause inflammation. This causes pain. Certain things may also trigger migraines, such as:  Alcohol.  Smoking.  Stress.  Menstruation.  Aged cheeses.  Foods or drinks that contain nitrates, glutamate, aspartame, or tyramine.  Lack of sleep.  Chocolate.  Caffeine.  Hunger.  Physical exertion.  Fatigue.  Medicines used to treat chest pain (nitroglycerine), birth control pills, estrogen, and some blood pressure medicines. SIGNS AND SYMPTOMS  Pain on one or both sides of your head.  Pulsating or throbbing pain.  Severe pain that prevents daily activities.  Pain that is aggravated by any physical activity.  Nausea, vomiting, or both.  Dizziness.  Pain with exposure to bright lights, loud noises, or activity.  General sensitivity to bright lights, loud noises, or smells. Before you get a migraine, you may get warning signs that a migraine is coming (aura). An aura may include:  Seeing flashing lights.  Seeing bright spots, halos, or zigzag lines.  Having tunnel vision or blurred vision.  Having feelings of numbness or tingling.  Having  trouble talking.  Having muscle weakness. DIAGNOSIS  A migraine headache is often diagnosed based on:  Symptoms.  Physical exam.  A CT scan or MRI of your head. These imaging tests cannot diagnose migraines, but they can help rule out other causes of headaches. TREATMENT Medicines may be given for pain and nausea. Medicines can also be given to help prevent recurrent migraines.  HOME CARE INSTRUCTIONS  Only take over-the-counter or prescription medicines for pain or discomfort as directed by your health care provider. The use of long-term narcotics is not recommended.  Lie down in a dark, quiet room when you have a migraine.  Keep a journal to find out what may trigger your migraine headaches. For example, write down:  What you eat and drink.  How much sleep you get.  Any change to your diet or medicines.  Limit alcohol consumption.  Quit smoking if you smoke.  Get 7-9 hours of sleep, or as recommended by your health care provider.  Limit stress.  Keep lights dim if bright lights bother you and make your migraines worse. SEEK IMMEDIATE MEDICAL CARE IF:   Your migraine becomes severe.  You have a fever.  You have a stiff neck.  You have vision loss.  You have muscular weakness or loss of muscle control.  You start losing your balance or have trouble walking.  You feel faint or pass out.  You have severe symptoms that are different from your first symptoms. MAKE SURE YOU:   Understand these instructions.  Will watch your condition.  Will get help right away if you are  not doing well or get worse. Document Released: 07/16/2005 Document Revised: 11/30/2013 Document Reviewed: 03/23/2013 [employer] Patient Information 2015 [place], [place]. This information is not intended to replace advice given to you by your health care provider. Make sure you discuss any questions you have with your health care provider.

## 2015-05-02 NOTE — ED Notes (Signed)
Bed: YH88 Expected date:  Expected time:  Means of arrival:  Comments: EMS- 24yo M, headache

## 2015-05-02 NOTE — ED Notes (Signed)
Pt escorted to discharge window. Pt verbalized understanding discharge instructions. In no acute distress.  

## 2015-05-02 NOTE — ED Provider Notes (Signed)
CSN: [id number]     Arrival date & time 05/02/15  0901 History   First MD Initiated Contact with Patient 05/02/15 [phone]     Chief Complaint  Patient presents with  . Headache     (Consider location/radiation/quality/duration/timing/severity/associated sxs/prior Treatment) HPI Comments: 24 y.o. Male with history of recurrent migraine headaches since 2013 presents for headache.  The patient reports that he woke up with the headache around 6 AM and went back to sleep but when he woke back up the headache was unbearable.  He reports this feels very similar to his previous migraine headaches where he has generalized head throbbing.  He says that light and sound made the headache worse.  No nausea vomiting or diarrhea.  No recent illness.  No fever or chills.  No neurologic changes.  No weakness.  He was given 30 mg of toradol en route by EMS and had complete resolution of his symptoms.  Patient has noted that headaches seem to come on after working out hard but seem to come at random times during the day.  He reports drinking multiple alcoholic drinks yesterday and not drinking any water.  Has felt dehydrated.   Past Medical History  Diagnosis Date  . Migraine   . Seizure [facility])    History reviewed. No pertinent past surgical history. No family history on file. Social History  Substance Use Topics  . Smoking status: Never Smoker   . Smokeless tobacco: None  . Alcohol Use: No    Review of Systems  Constitutional: Negative for fever, chills, appetite change and fatigue.  HENT: Negative for congestion, ear pain, postnasal drip, rhinorrhea and sinus pressure.   Eyes: Positive for photophobia (resolved). Negative for pain, redness and visual disturbance.  Respiratory: Negative for cough, chest tightness and shortness of breath.   Cardiovascular: Negative for chest pain and palpitations.  Gastrointestinal: Negative for nausea, vomiting, abdominal pain, diarrhea and constipation.  Genitourinary:  Negative for dysuria, urgency and hematuria.  Musculoskeletal: Negative for myalgias, back pain and gait problem.  Skin: Negative for rash.  Neurological: Positive for headaches (resolved). Negative for dizziness, seizures, facial asymmetry, weakness, light-headedness and numbness.  Hematological: Negative for adenopathy. Does not bruise/bleed easily.      Allergies  No known allergies  Home Medications   Prior to Admission medications   Medication Sig Start Date End Date Taking? Authorizing Provider  methocarbamol (ROBAXIN) 500 MG tablet Take 1 tablet (500 mg total) by mouth 2 (two) times daily. Patient not taking: Reported on 05/02/2015 12/07/13   Hyman Bible, PA-C  naproxen (NAPROSYN) 500 MG tablet Take 1 tablet (500 mg total) by mouth 2 (two) times daily. Patient not taking: Reported on 05/02/2015 01/06/14   Renee A Kuneff, DO   BP 136/83 mmHg  Pulse 55  Temp(Src) 97.9 F (36.6 C) (Oral)  Resp 16  Ht 6\' 1"  (1.854 m)  Wt 165 lb (74.844 kg)  BMI 21.77 kg/m2  SpO2 100% Physical Exam  Constitutional: He is oriented to person, place, and time. He appears well-developed and well-nourished. No distress.  HENT:  Head: Normocephalic and atraumatic.  Right Ear: External ear normal.  Left Ear: External ear normal.  Mouth/Throat: Oropharynx is clear and moist. No oropharyngeal exudate.  Eyes: EOM are normal. Pupils are equal, round, and reactive to light.  Neck: Normal range of motion and full passive range of motion without pain. Neck supple.  Cardiovascular: Normal rate, regular rhythm, normal heart sounds and intact distal pulses.   No murmur  heard. Pulmonary/Chest: Effort normal. No respiratory distress. He has no wheezes. He has no rales.  Abdominal: Soft. He exhibits no distension. There is no tenderness. There is no rebound.  Musculoskeletal: Normal range of motion. He exhibits no edema or tenderness.  Neurological: He is alert and oriented to person, place, and time. He  has normal strength. No cranial nerve deficit or sensory deficit. He exhibits normal muscle tone. Coordination normal.  Skin: Skin is warm and dry. No rash noted. He is not diaphoretic.  Vitals reviewed.   ED Course  Procedures (including critical care time) Labs Review Labs Reviewed - No data to display  Imaging Review No results found. I have personally reviewed and evaluated these images and lab results as part of my medical decision-making.   EKG Interpretation None      MDM  Patient was seen and evaluated in stable condition. Well appearing.  Normal neurologic examination.  Asymptomatic.  Patient with recurrent similar headaches since 2013, no redflag symptoms.  Patient discharged home in stable condition with instruction to hydrate and to follow up with neurology and his PCP.  He was given strict return precautions.  Patient expressed understanding and agreement with plan of care.  All questions were answered prior to discharge.  Final diagnoses:  None    1. Headache, recurrent, likely migraine    Harvel Quale, MD 05/02/15 [phone]

## 2015-05-10 ENCOUNTER — Encounter: Payer: Self-pay | Admitting: Diagnostic Neuroimaging

## 2015-05-10 ENCOUNTER — Ambulatory Visit (INDEPENDENT_AMBULATORY_CARE_PROVIDER_SITE_OTHER): Payer: Medicaid Other | Admitting: Diagnostic Neuroimaging

## 2015-05-10 VITALS — BP 144/85 | HR 62 | Ht 73.0 in | Wt 170.0 lb

## 2015-05-10 DIAGNOSIS — G43109 Migraine with aura, not intractable, without status migrainosus: Secondary | ICD-10-CM

## 2015-05-10 DIAGNOSIS — G40909 Epilepsy, unspecified, not intractable, without status epilepticus: Secondary | ICD-10-CM

## 2015-05-10 NOTE — Patient Instructions (Signed)
Thank you for coming to see [place] at [facility]. I hope we have been able to provide you high quality care today.  You may receive a patient satisfaction survey over the next few weeks. We would appreciate your feedback and comments so that we may continue to improve ourselves and the health of our patients.  - monitor symptoms - use ibuprofen or tylenol as needed for breakthrough migraines   ~~~~~~~~~~~~~~~~~~~~~~~~~~~~~~~~~~~~~~~~~~~~~~~~~~~~~~~~~~~~~~~~~  DR. Raquan Iannone'S GUIDE TO HAPPY AND HEALTHY LIVING These are some of my general health and wellness recommendations. Some of them may apply to you better than others. Please use common sense as you try these suggestions and feel free to ask me any questions.   ACTIVITY/FITNESS Mental, social, emotional and physical stimulation are very important for brain and body health. Try learning a new activity (arts, music, language, sports, games).  Keep moving your body to the best of your abilities. You can do this at home, inside or outside, the park, community center, gym or anywhere you like. Consider a physical therapist or personal trainer to get started. Consider the app Sworkit. Fitness trackers such as smart-watches, smart-phones or Fitbits can help as well.   NUTRITION Eat more plants: colorful vegetables, nuts, seeds and berries.  Eat less sugar, salt, preservatives and processed foods.  Avoid toxins such as cigarettes and alcohol.  Drink water when you are thirsty. Warm water with a slice of lemon is an excellent morning drink to start the day.  Consider these websites for more information The Nutrition Source (https://www.henry-hernandez.biz/) Precision Nutrition (WindowBlog.ch)   RELAXATION Consider practicing mindfulness meditation or other relaxation techniques such as deep breathing, prayer, yoga, tai chi, massage. See website mindful.org or the apps Headspace  or Calm to help get started.   SLEEP Try to get at least 7-8+ hours sleep per day. Regular exercise and reduced caffeine will help you sleep better. Practice good sleep hygeine techniques. See website sleep.org for more information.   PLANNING Prepare estate planning, living will, healthcare POA documents. Sometimes this is best planned with the help of an attorney. Theconversationproject.org and agingwithdignity.org are excellent resources.

## 2015-05-10 NOTE — Progress Notes (Signed)
[facility]  PATIENT: Bruce Shields DOB: Mar 13, 1991  REFERRING CLINICIAN: Lonia Skinner HISTORY FROM: patient  REASON FOR VISIT: new consult    HISTORICAL  CHIEF COMPLAINT:  Chief Complaint  Patient presents with  . Migraine    rm 7, New Patient    HISTORY OF PRESENT ILLNESS:   24 year old right-handed male here for evaluation of headaches.  Around 2012, patient had onset of unilateral, right or left sided, throbbing severe headaches lasting hours at a time. Headaches associated with photophobia and phonophobia. No nausea or vomiting. Sometimes he sees sparkles and "fireworks" flashing in front of his eyes before headaches start. Sometimes headaches start when he wakes up. Stress and dehydration seem to trigger headaches. He averages 4-5 headaches per year. He typically takes ibuprofen and lays down in a dark quiet room. Patient has family history of migraine in his older sister. No major accidents or head traumas. No history of meningitis or encephalitis. Recently patient went to the emergency room for headache management, was given IV medication which seemed to help resolve the symptoms.  Separately patient has history of seizure disorder from age 36 years old until age 82 years old. He had 2 types of seizures including grand mal, generalized convulsive seizures with tongue biting, loss of consciousness. He also had "lighter seizures" which she describes as 2-3 minutes of twitching in his face, arm, leg, sometimes on the right side, sometimes on the left side, associated with drooling and zoning out. Patient was treated with antiseizure medication for several years. Around age 39 years old he weaned himself off medication because he felt that he didn't not need the medication anymore. Fortunately patient did not have any further seizures.    REVIEW OF SYSTEMS: Full 14 system review of systems performed and notable only for weight loss blurred vision snoring  dizziness headache skin sensitivity itching.  ALLERGIES: Allergies  Allergen Reactions  . No Known Allergies     HOME MEDICATIONS: Outpatient Prescriptions Prior to Visit  Medication Sig Dispense Refill  . methocarbamol (ROBAXIN) 500 MG tablet Take 1 tablet (500 mg total) by mouth 2 (two) times daily. (Patient not taking: Reported on 05/02/2015) 20 tablet 0  . naproxen (NAPROSYN) 500 MG tablet Take 1 tablet (500 mg total) by mouth 2 (two) times daily. (Patient not taking: Reported on 05/02/2015) 60 tablet 0   Facility-Administered Medications Prior to Visit  Medication Dose Route Frequency Provider Last Rate Last Dose  . TDaP (BOOSTRIX) injection 0.5 mL  0.5 mL Intramuscular Once Renee A Kuneff, DO        PAST MEDICAL HISTORY: Past Medical History  Diagnosis Date  . Migraine   . Seizure [facility])     age 74 -50    PAST SURGICAL HISTORY: Past Surgical History  Procedure Laterality Date  . Keloid surgery Right 2013    face    FAMILY HISTORY: Family History  Problem Relation Age of Onset  . Migraines Sister   . Seizures Other     SOCIAL HISTORY:  Social History   Social History  . Marital Status: Married    Spouse Name: [place]  . Number of Children: 2  . Years of Education: 12   Occupational History  .      unemployed   Social History Main Topics  . Smoking status: Never Smoker   . Smokeless tobacco: Not on file  . Alcohol Use: No  . Drug Use: No  . Sexual Activity: Not on file  Other Topics Concern  . Not on file   Social History Narrative   Lives at home with family   Caffeine use- none     PHYSICAL EXAM  GENERAL EXAM/CONSTITUTIONAL: Vitals:  Filed Vitals:   05/10/15 0848  BP: 144/85  Pulse: 62  Height: 6\' 1"  (1.854 m)  Weight: 170 lb (77.111 kg)     Body mass index is 22.43 kg/(m^2).  Visual Acuity Screening   Right eye Left eye Both eyes  Without correction: 20/20 20/20   With correction:        Patient is in no distress; well  developed, nourished and groomed; neck is supple  CARDIOVASCULAR:  Examination of carotid arteries is normal; no carotid bruits  Regular rate and rhythm, no murmurs  Examination of peripheral vascular system by observation and palpation is normal  EYES:  Ophthalmoscopic exam of optic discs and posterior segments is normal; no papilledema or hemorrhages  MUSCULOSKELETAL:  Gait, strength, tone, movements noted in Neurologic exam below  NEUROLOGIC: MENTAL STATUS:  No flowsheet data found.  awake, alert, oriented to person, place and time  recent and remote memory intact  normal attention and concentration  language fluent, comprehension intact, naming intact,   fund of knowledge appropriate  CRANIAL NERVE:   2nd - no papilledema on fundoscopic exam  2nd, 3rd, 4th, 6th - pupils equal and reactive to light, visual fields full to confrontation, extraocular muscles intact, no nystagmus  5th - facial sensation symmetric  7th - facial strength symmetric  8th - hearing intact  9th - palate elevates symmetrically, uvula midline  11th - shoulder shrug symmetric  12th - tongue protrusion midline  MOTOR:   normal bulk and tone, full strength in the BUE, BLE  SENSORY:   normal and symmetric to light touch, temperature, vibration  COORDINATION:   finger-nose-finger, fine finger movements normal  REFLEXES:   deep tendon reflexes present and symmetric  GAIT/STATION:   narrow based gait; able to walk tandem; romberg is negative    DIAGNOSTIC DATA (LABS, IMAGING, TESTING) - I reviewed patient records, labs, notes, testing and imaging myself where available.  Lab Results  Component Value Date   WBC 7.3 03/13/2013   HGB 16.0 03/13/2013   HCT 45.3 03/13/2013   MCV 90.8 03/13/2013   PLT 158 03/13/2013      Component Value Date/Time   NA 137 03/13/2013 1409   K 4.4 03/13/2013 1409   CL 100 03/13/2013 1409   CO2 28 03/13/2013 1409   GLUCOSE 82 03/13/2013  1409   BUN 8 03/13/2013 1409   CREATININE 0.88 03/13/2013 1409   CALCIUM 9.4 03/13/2013 1409   PROT 7.6 03/13/2013 1409   ALBUMIN 4.1 03/13/2013 1409   AST 46* 03/13/2013 1409   ALT 36 03/13/2013 1409   ALKPHOS 52 03/13/2013 1409   BILITOT 0.4 03/13/2013 1409   GFRNONAA >90 03/13/2013 1409   GFRAA >90 03/13/2013 1409   No results found for: CHOL, HDL, LDLCALC, LDLDIRECT, TRIG, CHOLHDL No results found for: HGBA1C No results found for: VITAMINB12 No results found for: TSH   10/19/11 CT head [I reviewed images myself and agree with interpretation. -VRP]  - normal     ASSESSMENT AND PLAN  24 y.o. year old male here with migraine with aura since 2012 (4-5 HA per year). Also with history of seizure disorder (age [age] years old; grand mal and petit mal; no seizures since age 48 years old; not on medication now).  Dx:  Migraine  with aura and without status migrainosus, not intractable  Seizure disorder (HCC)    PLAN: - conservative mgmt of migraines for now; offered triptan but pt wants to hold off  - monitor headaches - healthy habits reviewed  Return if symptoms worsen or fail to improve, for return to PCP.    Penni Bombard, MD 46/10/7996, 7:21 AM Certified in Neurology, Neurophysiology and Neuroimaging  [facility] [address] [place], [place] [address] [phone]

## 2015-07-07 ENCOUNTER — Encounter: Payer: Self-pay | Admitting: Family Medicine

## 2016-08-31 ENCOUNTER — Encounter (HOSPITAL_COMMUNITY): Payer: Self-pay

## 2016-08-31 ENCOUNTER — Emergency Department (HOSPITAL_COMMUNITY)
Admission: EM | Admit: 2016-08-31 | Discharge: 2016-08-31 | Disposition: A | Discharge status: Home / Self Care | Payer: Medicaid Other | Attending: Emergency Medicine | Admitting: Emergency Medicine

## 2016-08-31 DIAGNOSIS — I10 Essential (primary) hypertension: Secondary | ICD-10-CM | POA: Insufficient documentation

## 2016-08-31 DIAGNOSIS — Z79899 Other long term (current) drug therapy: Secondary | ICD-10-CM | POA: Insufficient documentation

## 2016-08-31 DIAGNOSIS — R51 Headache: Secondary | ICD-10-CM | POA: Diagnosis present

## 2016-08-31 DIAGNOSIS — G8929 Other chronic pain: Secondary | ICD-10-CM

## 2016-08-31 MED ORDER — BUTALBITAL-APAP-CAFFEINE 50-325-40 MG PO TABS: 1.0000 | ORAL_TABLET | Freq: Four times a day (QID) | ORAL | 0 refills | Status: AC | PRN

## 2016-08-31 NOTE — Discharge Instructions (Signed)
Read the information below.  Use the prescribed medication as directed.  Please discuss all new medications with your pharmacist.  You may return to the Emergency Department at any time for worsening condition or any new symptoms that concern you.   ° °RETURN IMMEDIATELY IF you develop a sudden, severe headache or confusion, become poorly responsive or faint, develop a fever above 100.4F or problem breathing, have a change in speech, vision, swallowing, or understanding, or develop new weakness, numbness, tingling, incoordination, or have a seizure.  °

## 2016-08-31 NOTE — ED Provider Notes (Signed)
[facility] DEPT Provider Note   CSN: [id number] Arrival date & time: 08/31/16  1227     History   Chief Complaint Chief Complaint  Patient presents with  . Migraine    HPI Bruce Shields is a 26 y.o. male.  HPI   Pt with hx migraine headaches presents with resolved headache that he had for the past two days.  States he has the headaches only every few months.  Pain is always on the right temple and throbbing, associated sensitivity to light and sound.  This headache is typical of his chronic headaches.  Took 600mg  ibuprofen this morning without significant relief though his headache was resolved by the time he was seen.  He is requesting a trial of Fioricet.    Denies fevers, recent illness, head trauma, neck pain or stiffness, "worst" headache of life, sudden onset or "thunderclap" headache.  Denies focal neurologic deficits.   PCP [facility].    Past Medical History:  Diagnosis Date  . Migraine   . Seizure [facility])    age 28 -67    Patient Active Problem List   Diagnosis Date Noted  . Pain of right thumb 01/06/2014  . Back pain 01/06/2014  . Full body hives 07/15/2013  . Chlamydia contact 04/01/2013  . ESSENTIAL HYPERTENSION, BENIGN 04/18/2010  . POLYURIA 04/18/2010  . ABDOMINAL PAIN OTHER SPECIFIED SITE 04/18/2010  . 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 OPPOSITIONAL DEFIANT DISORDER 09/26/2006  . ATTENTION DEFICIT, W/HYPERACTIVITY 09/26/2006  . CONVULSIONS, SEIZURES, NOS 09/26/2006  . Headache 09/26/2006    Past Surgical History:  Procedure Laterality Date  . keloid surgery Right 2013   face       Home Medications    Prior to Admission medications   Medication Sig Start Date End Date Taking? Authorizing Provider  butalbital-acetaminophen-caffeine (FIORICET, ESGIC) 50-325-40 MG tablet Take 1 tablet by mouth every 6 (six) hours as needed for headache. 08/31/16 08/31/17  Clayton Bibles, PA-C    Family History Family  History  Problem Relation Age of Onset  . Migraines Sister   . Seizures Other     Social History Social History  Substance Use Topics  . Smoking status: Never Smoker  . Smokeless tobacco: Never Used  . Alcohol use No     Allergies   Patient has no known allergies.   Review of Systems Review of Systems  Constitutional: Negative for chills and fever.  Musculoskeletal: Negative for neck pain and neck stiffness.  Skin: Negative for color change.  Allergic/Immunologic: Negative for immunocompromised state.  Neurological: Positive for headaches. Negative for weakness and numbness.  Hematological: Does not bruise/bleed easily.  Psychiatric/Behavioral: Negative for self-injury.     Physical Exam Updated Vital Signs BP 133/75 (BP Location: Left Arm)   Pulse (!) 57   Temp 97.8 F (36.6 C) (Oral)   Resp 16   Ht 6\' 1"  (1.854 m)   Wt 90.7 kg   SpO2 100%   BMI 26.39 kg/m   Physical Exam  Constitutional: He appears well-developed and well-nourished.  HENT:  Head: Normocephalic and atraumatic.  Neck: Neck supple.  Cardiovascular: Normal rate and regular rhythm.   Pulmonary/Chest: Effort normal and breath sounds normal.  Neurological: He is alert.  CN II-XII intact, EOMs intact, no pronator drift, grip strengths equal bilaterally; strength 5/5 in all extremities, sensation intact in all extremities; finger to nose, heel to shin, rapid alternating movements normal.     Nursing note and vitals reviewed.  ED Treatments / Results  Labs (all labs ordered are listed, but only abnormal results are displayed) Labs Reviewed - No data to display  EKG  EKG Interpretation None       Radiology No results found.  Procedures Procedures (including critical care time)  Medications Ordered in ED Medications - No data to display   Initial Impression / Assessment and Plan / ED Course  I have reviewed the triage vital signs and the nursing notes.  Pertinent labs &  imaging results that were available during my care of the patient were reviewed by me and considered in my medical decision making (see chart for details).     Afebrile, nontoxic patient with typical migraine headache that resolved prior to being seen..  No red flags including head trauma, fevers, meningeal signs, focal neurologic deficits.  Pt requesting fioricet prescription, I prescribed #10.  D/C home with PCP follow up.  Discussed result, findings, treatment, and follow up  with patient.  Pt given return precautions.  Pt verbalizes understanding and agrees with plan.     Final Clinical Impressions(s) / ED Diagnoses   Final diagnoses:  Chronic nonintractable headache, unspecified headache type    New Prescriptions New Prescriptions   BUTALBITAL-ACETAMINOPHEN-CAFFEINE (FIORICET, ESGIC) 50-325-40 MG TABLET    Take 1 tablet by mouth every 6 (six) hours as needed for headache.     Clayton Bibles, PA-C 08/31/16 [place], MD 09/03/16 2209

## 2016-08-31 NOTE — ED Triage Notes (Signed)
PT RECEIVED FROM HOME VIA EMS C/O A MIGRAINE SINCE YESTERDAY WITH N/V/D. PT ALSO HAS A HX OF SEIZURES.

## 2016-08-31 NOTE — ED Notes (Signed)
ED Provider at bedside. 

## 2016-11-09 ENCOUNTER — Encounter: Payer: Self-pay | Admitting: Family Medicine

## 2016-11-09 ENCOUNTER — Other Ambulatory Visit (HOSPITAL_COMMUNITY)
Admission: RE | Admit: 2016-11-09 | Discharge: 2016-11-09 | Disposition: A | Discharge status: Home / Self Care | Payer: Medicaid Other | Source: Ambulatory Visit | Attending: Family Medicine | Admitting: Family Medicine

## 2016-11-09 ENCOUNTER — Ambulatory Visit (INDEPENDENT_AMBULATORY_CARE_PROVIDER_SITE_OTHER): Payer: Medicaid Other | Admitting: Family Medicine

## 2016-11-09 VITALS — BP 140/70 | HR 82 | Temp 97.9°F | Ht 73.0 in | Wt 203.4 lb

## 2016-11-09 DIAGNOSIS — Z202 Contact with and (suspected) exposure to infections with a predominantly sexual mode of transmission: Secondary | ICD-10-CM | POA: Insufficient documentation

## 2016-11-09 DIAGNOSIS — Z7689 Persons encountering health services in other specified circumstances: Secondary | ICD-10-CM

## 2016-11-09 DIAGNOSIS — L708 Other acne: Secondary | ICD-10-CM

## 2016-11-09 MED ORDER — CLINDAMYCIN PHOS-BENZOYL PEROX 1-5 % EX GEL: Freq: Two times a day (BID) | CUTANEOUS | 0 refills | Status: DC

## 2016-11-09 NOTE — Progress Notes (Signed)
Subjective:  Bruce Shields is a 26 y.o. male who presents to the [facility] today to establish care and for concern for STDs  HPI:  Concern for STDs:  Patient is a long-term relationship with a woman of his child and has reportedly been with for women aside from girlfriend within past two weeks. Has used protection for some of the sexual encounters.  Unclear of their sexual history. States that as far as he knows none of these women use IV drugs. He denies any penile discharge, dysuria or lesions abdominal pain, nausea, vomiting or diarrhea.   PMH: migraines Tobacco use: never smoked Medication: reviewed and updated ROS: see HPI   Objective:  Physical Exam: BP 140/70   Pulse 82   Temp 97.9 F (36.6 C) (Oral)   Ht 6\' 1"  (1.854 m)   Wt 92.3 kg (203 lb 6.4 oz)   SpO2 97%   BMI 26.84 kg/m   Gen: 26yo M in NAD, resting comfortably CV: RRR with no murmurs appreciated Pulm: NWOB, CTAB with no crackles, wheezes, or rhonchi GI: Normal bowel sounds present. Soft, Nontender, Nondistended. Skin: warm, dry, non-inflammatory acne on forehead  GU: normal appearing male genitalia without lesions Psych: Normal affect and thought content  Results for orders placed or performed in visit on 11/09/16 (from the past 72 hour(s))  HIV antibody (with reflex)     Status: None   Collection Time: 11/09/16  4:33 PM  Result Value Ref Range   HIV Screen 4th Generation wRfx Non Reactive Non Reactive  Hepatitis C Antibody     Status: None   Collection Time: 11/09/16  4:33 PM  Result Value Ref Range   Hep C Virus Ab <0.1 0.0 - 0.9 s/co ratio    Comment:                                   Negative:     < 0.8                              Indeterminate: 0.8 - 0.9                                   Positive:     > 0.9  The CDC recommends that a positive HCV antibody result  be followed up with a HCV Nucleic Acid Amplification  test (672094).   Hepatitis B surface antigen     Status: None   Collection  Time: 11/09/16  4:33 PM  Result Value Ref Range   Hepatitis B Surface Ag Negative Negative  RPR     Status: None   Collection Time: 11/09/16  4:33 PM  Result Value Ref Range   RPR Ser Ql Non Reactive Non Reactive     Assessment/Plan:  Encounter for assessment of STD exposure Patient with multiple sexual encounters with women outside of his relationship.  Unclear of their sexual history of status of IVDU. Denies lesions, dysuria, penile discharge or other symptoms. No lesions noted on exam.  Patient wishes to only be contacted if results are positive.  - GC/chlamydia/Trich, HIV, RPR - no current symptoms, decided to hold off on abx - Hepatitis B and C  ACNE VULGARIS Mostly non-inflammatory acne with some black heads on forehead.  Using OCT medication without  much relief at this point.  Wants to try prescription medication. - benzaclin gel BID x12 weeks - f/u in 6 weeks

## 2016-11-09 NOTE — Patient Instructions (Addendum)
Tayon you were seen today for a visit to establish care and to have some tests to check for STDs.  I have also prescribed you an acne gel.  You can use this two times daily and should be used for 10-12 weeks.  Please follow up with me at that time.   Daniel L. Rosalyn Gess, [place] Resident PGY-1 11/09/2016 4:55 PM

## 2016-11-10 LAB — RPR: RPR: NONREACTIVE

## 2016-11-10 LAB — HEPATITIS B SURFACE ANTIGEN: HEP B S AG: NEGATIVE

## 2016-11-10 LAB — HIV ANTIBODY (ROUTINE TESTING W REFLEX): HIV SCREEN 4TH GENERATION: NONREACTIVE

## 2016-11-10 LAB — HEPATITIS C ANTIBODY

## 2016-11-11 DIAGNOSIS — Z7689 Persons encountering health services in other specified circumstances: Secondary | ICD-10-CM | POA: Insufficient documentation

## 2016-11-11 NOTE — Assessment & Plan Note (Signed)
Mostly non-inflammatory acne with some black heads on forehead.  Using OCT medication without much relief at this point.  Wants to try prescription medication. - benzaclin gel BID x12 weeks - f/u in 6 weeks

## 2016-11-11 NOTE — Assessment & Plan Note (Signed)
Patient with multiple sexual encounters with women outside of his relationship.  Unclear of their sexual history of status of IVDU. Denies lesions, dysuria, penile discharge or other symptoms. No lesions noted on exam.  Patient wishes to only be contacted if results are positive.  - GC/chlamydia/Trich, HIV, RPR - no current symptoms, decided to hold off on abx - Hepatitis B and C

## 2016-11-12 LAB — URINE CYTOLOGY ANCILLARY ONLY
Chlamydia: NEGATIVE
NEISSERIA GONORRHEA: NEGATIVE
TRICH (WINDOWPATH): NEGATIVE

## 2018-09-25 ENCOUNTER — Ambulatory Visit: Payer: PRIVATE HEALTH INSURANCE | Admitting: Family Medicine

## 2018-10-10 ENCOUNTER — Ambulatory Visit: Payer: Medicaid Other | Admitting: Family Medicine

## 2019-08-15 ENCOUNTER — Inpatient Hospital Stay (HOSPITAL_COMMUNITY)
Admission: EM | Admit: 2019-08-15 | Discharge: 2019-08-18 | DRG: 989 | Disposition: A | Discharge status: Home / Self Care | Payer: Medicaid Other | Attending: Neurosurgery | Admitting: Neurosurgery

## 2019-08-15 DIAGNOSIS — S064XAA Epidural hemorrhage with loss of consciousness status unknown, initial encounter: Secondary | ICD-10-CM | POA: Diagnosis present

## 2019-08-15 DIAGNOSIS — S064X9A Epidural hemorrhage with loss of consciousness of unspecified duration, initial encounter: Principal | ICD-10-CM | POA: Diagnosis present

## 2019-08-15 DIAGNOSIS — Z7289 Other problems related to lifestyle: Secondary | ICD-10-CM

## 2019-08-15 DIAGNOSIS — W3400XA Accidental discharge from unspecified firearms or gun, initial encounter: Secondary | ICD-10-CM

## 2019-08-15 DIAGNOSIS — S0193XA Puncture wound without foreign body of unspecified part of head, initial encounter: Secondary | ICD-10-CM

## 2019-08-15 DIAGNOSIS — Z20822 Contact with and (suspected) exposure to covid-19: Secondary | ICD-10-CM | POA: Diagnosis present

## 2019-08-15 DIAGNOSIS — G9389 Other specified disorders of brain: Secondary | ICD-10-CM | POA: Diagnosis present

## 2019-08-15 DIAGNOSIS — S061X9A Traumatic cerebral edema with loss of consciousness of unspecified duration, initial encounter: Secondary | ICD-10-CM | POA: Diagnosis present

## 2019-08-15 DIAGNOSIS — S0183XA Puncture wound without foreign body of other part of head, initial encounter: Secondary | ICD-10-CM | POA: Diagnosis present

## 2019-08-15 DIAGNOSIS — Y904 Blood alcohol level of 80-99 mg/100 ml: Secondary | ICD-10-CM | POA: Diagnosis present

## 2019-08-15 DIAGNOSIS — R110 Nausea: Secondary | ICD-10-CM | POA: Diagnosis not present

## 2019-08-15 DIAGNOSIS — S020XXA Fracture of vault of skull, initial encounter for closed fracture: Secondary | ICD-10-CM | POA: Diagnosis present

## 2019-08-15 HISTORY — DX: Unspecified convulsions: R56.9

## 2019-08-15 MED ORDER — SODIUM CHLORIDE 0.9 % IV BOLUS
1000.0000 mL | Freq: Once | INTRAVENOUS | Status: AC
  Administered 2019-08-16: 1000 mL via INTRAVENOUS

## 2019-08-15 MED ORDER — TETANUS-DIPHTH-ACELL PERTUSSIS 5-2.5-18.5 LF-MCG/0.5 IM SUSP
0.5000 mL | Freq: Once | INTRAMUSCULAR | Status: AC
  Administered 2019-08-16: 0.5 mL via INTRAMUSCULAR
  Filled 2019-08-15: qty 0.5

## 2019-08-15 MED ORDER — FENTANYL CITRATE (PF) 100 MCG/2ML IJ SOLN: 75.0000 ug | Freq: Once | INTRAMUSCULAR | Status: AC

## 2019-08-16 ENCOUNTER — Emergency Department (HOSPITAL_COMMUNITY): Payer: Medicaid Other

## 2019-08-16 ENCOUNTER — Encounter (HOSPITAL_COMMUNITY): Payer: Self-pay | Admitting: Neurosurgery

## 2019-08-16 DIAGNOSIS — R110 Nausea: Secondary | ICD-10-CM | POA: Diagnosis not present

## 2019-08-16 DIAGNOSIS — G9389 Other specified disorders of brain: Secondary | ICD-10-CM | POA: Diagnosis present

## 2019-08-16 DIAGNOSIS — S064X9A Epidural hemorrhage with loss of consciousness of unspecified duration, initial encounter: Secondary | ICD-10-CM | POA: Diagnosis present

## 2019-08-16 DIAGNOSIS — Y904 Blood alcohol level of 80-99 mg/100 ml: Secondary | ICD-10-CM | POA: Diagnosis present

## 2019-08-16 DIAGNOSIS — S020XXA Fracture of vault of skull, initial encounter for closed fracture: Secondary | ICD-10-CM | POA: Diagnosis present

## 2019-08-16 DIAGNOSIS — W3400XA Accidental discharge from unspecified firearms or gun, initial encounter: Secondary | ICD-10-CM

## 2019-08-16 DIAGNOSIS — Z20822 Contact with and (suspected) exposure to covid-19: Secondary | ICD-10-CM | POA: Diagnosis present

## 2019-08-16 DIAGNOSIS — Z7289 Other problems related to lifestyle: Secondary | ICD-10-CM | POA: Diagnosis not present

## 2019-08-16 DIAGNOSIS — S0193XA Puncture wound without foreign body of unspecified part of head, initial encounter: Secondary | ICD-10-CM

## 2019-08-16 DIAGNOSIS — S064XAA Epidural hemorrhage with loss of consciousness status unknown, initial encounter: Secondary | ICD-10-CM | POA: Diagnosis present

## 2019-08-16 DIAGNOSIS — S0183XA Puncture wound without foreign body of other part of head, initial encounter: Secondary | ICD-10-CM | POA: Diagnosis present

## 2019-08-16 DIAGNOSIS — S061X9A Traumatic cerebral edema with loss of consciousness of unspecified duration, initial encounter: Secondary | ICD-10-CM | POA: Diagnosis present

## 2019-08-16 LAB — COMPREHENSIVE METABOLIC PANEL
ALT: 27 U/L (ref 0–44)
AST: 37 U/L (ref 15–41)
Albumin: 4.6 g/dL (ref 3.5–5.0)
Alkaline Phosphatase: 55 U/L (ref 38–126)
Anion gap: 11 (ref 5–15)
BUN: 9 mg/dL (ref 6–20)
CO2: 22 mmol/L (ref 22–32)
Calcium: 9.1 mg/dL (ref 8.9–10.3)
Chloride: 105 mmol/L (ref 98–111)
Creatinine, Ser: 1.03 mg/dL (ref 0.61–1.24)
GFR calc Af Amer: >60 mL/min (ref >60)
GFR calc non Af Amer: >60 mL/min (ref >60)
Glucose, Bld: 101 mg/dL — ABNORMAL HIGH (ref 70–99)
Potassium: 3.6 mmol/L (ref 3.5–5.1)
Sodium: 138 mmol/L (ref 135–145)
Total Bilirubin: 0.5 mg/dL (ref 0.3–1.2)
Total Protein: 7.8 g/dL (ref 6.5–8.1)

## 2019-08-16 LAB — CBC
HCT: 43.5 % (ref 39.0–52.0)
HCT: 46.9 % (ref 39.0–52.0)
Hemoglobin: 14.9 g/dL (ref 13.0–17.0)
Hemoglobin: 15.8 g/dL (ref 13.0–17.0)
MCH: 30.6 pg (ref 26.0–34.0)
MCH: 30.8 pg (ref 26.0–34.0)
MCHC: 33.7 g/dL (ref 30.0–36.0)
MCHC: 34.3 g/dL (ref 30.0–36.0)
MCV: 90.1 fL (ref 80.0–100.0)
MCV: 90.9 fL (ref 80.0–100.0)
Platelets: 188 10*3/uL (ref 150–400)
Platelets: 193 10*3/uL (ref 150–400)
RBC: 4.83 MIL/uL (ref 4.22–5.81)
RBC: 5.16 MIL/uL (ref 4.22–5.81)
RDW: 12 % (ref 11.5–15.5)
RDW: 12.1 % (ref 11.5–15.5)
WBC: 12.8 10*3/uL — ABNORMAL HIGH (ref 4.0–10.5)
WBC: 7.1 10*3/uL (ref 4.0–10.5)
nRBC: 0 % (ref 0.0–0.2)
nRBC: 0 % (ref 0.0–0.2)

## 2019-08-16 LAB — BASIC METABOLIC PANEL
Anion gap: 12 (ref 5–15)
BUN: 8 mg/dL (ref 6–20)
CO2: 22 mmol/L (ref 22–32)
Calcium: 9 mg/dL (ref 8.9–10.3)
Chloride: 101 mmol/L (ref 98–111)
Creatinine, Ser: 0.94 mg/dL (ref 0.61–1.24)
GFR calc Af Amer: >60 mL/min (ref >60)
GFR calc non Af Amer: >60 mL/min (ref >60)
Glucose, Bld: 130 mg/dL — ABNORMAL HIGH (ref 70–99)
Potassium: 3.7 mmol/L (ref 3.5–5.1)
Sodium: 135 mmol/L (ref 135–145)

## 2019-08-16 LAB — PROTIME-INR
INR: 1.1 (ref 0.8–1.2)
Prothrombin Time: 14 seconds (ref 11.4–15.2)

## 2019-08-16 LAB — RESPIRATORY PANEL BY RT PCR (FLU A&B, COVID)
Influenza A by PCR: NEGATIVE
Influenza B by PCR: NEGATIVE
SARS Coronavirus 2 by RT PCR: NEGATIVE

## 2019-08-16 LAB — I-STAT CHEM 8, ED
BUN: 10 mg/dL (ref 6–20)
Calcium, Ion: 1.1 mmol/L — ABNORMAL LOW (ref 1.15–1.40)
Chloride: 103 mmol/L (ref 98–111)
Creatinine, Ser: 1.1 mg/dL (ref 0.61–1.24)
Glucose, Bld: 96 mg/dL (ref 70–99)
HCT: 49 % (ref 39.0–52.0)
Hemoglobin: 16.7 g/dL (ref 13.0–17.0)
Potassium: 3.4 mmol/L — ABNORMAL LOW (ref 3.5–5.1)
Sodium: 140 mmol/L (ref 135–145)
TCO2: 25 mmol/L (ref 22–32)

## 2019-08-16 LAB — LACTIC ACID, PLASMA: Lactic Acid, Venous: 2.1 mmol/L (ref 0.5–1.9)

## 2019-08-16 LAB — ETHANOL: Alcohol, Ethyl (B): 87 mg/dL — ABNORMAL HIGH (ref <10)

## 2019-08-16 LAB — MRSA PCR SCREENING: MRSA by PCR: NEGATIVE

## 2019-08-16 LAB — CDS SEROLOGY

## 2019-08-16 LAB — HIV ANTIBODY (ROUTINE TESTING W REFLEX): HIV Screen 4th Generation wRfx: NONREACTIVE

## 2019-08-16 LAB — SAMPLE TO BLOOD BANK

## 2019-08-16 MED ORDER — CHLORHEXIDINE GLUCONATE CLOTH 2 % EX PADS
6.0000 | MEDICATED_PAD | Freq: Every day | CUTANEOUS | Status: DC
  Administered 2019-08-17: 14:00:00 6 via TOPICAL

## 2019-08-16 MED ORDER — ACETAMINOPHEN 325 MG PO TABS: 650.0000 mg | ORAL_TABLET | ORAL | Status: DC | PRN

## 2019-08-16 MED ORDER — IOHEXOL 350 MG/ML SOLN
75.0000 mL | Freq: Once | INTRAVENOUS | Status: AC | PRN
  Administered 2019-08-16: 75 mL via INTRAVENOUS

## 2019-08-16 MED ORDER — PANTOPRAZOLE SODIUM 40 MG IV SOLR
40.0000 mg | Freq: Every day | INTRAVENOUS | Status: DC
  Administered 2019-08-16 – 2019-08-17 (×3): 40 mg via INTRAVENOUS
  Filled 2019-08-16 (×3): qty 40

## 2019-08-16 MED ORDER — LIDOCAINE-EPINEPHRINE (PF) 2 %-1:200000 IJ SOLN
20.0000 mL | Freq: Once | INTRAMUSCULAR | Status: DC
  Filled 2019-08-16: qty 20

## 2019-08-16 MED ORDER — ONDANSETRON 4 MG PO TBDP: 4.0000 mg | ORAL_TABLET | Freq: Four times a day (QID) | ORAL | Status: DC | PRN

## 2019-08-16 MED ORDER — DOCUSATE SODIUM 100 MG PO CAPS
100.0000 mg | ORAL_CAPSULE | Freq: Two times a day (BID) | ORAL | Status: DC
  Administered 2019-08-16 – 2019-08-17 (×2): 100 mg via ORAL
  Filled 2019-08-16 (×3): qty 1

## 2019-08-16 MED ORDER — ACETAMINOPHEN 500 MG PO TABS
1000.0000 mg | ORAL_TABLET | Freq: Four times a day (QID) | ORAL | Status: DC
  Administered 2019-08-16 – 2019-08-18 (×10): 1000 mg via ORAL
  Filled 2019-08-16 (×10): qty 2

## 2019-08-16 MED ORDER — SODIUM CHLORIDE 0.9 % IV SOLN: INTRAVENOUS | Status: DC

## 2019-08-16 MED ORDER — FENTANYL CITRATE (PF) 100 MCG/2ML IJ SOLN
INTRAMUSCULAR | Status: AC
  Administered 2019-08-16: 50 ug via INTRAVENOUS
  Filled 2019-08-16: qty 2

## 2019-08-16 MED ORDER — CEFAZOLIN SODIUM-DEXTROSE 2-4 GM/100ML-% IV SOLN
2.0000 g | Freq: Once | INTRAVENOUS | Status: AC
  Administered 2019-08-16: 2 g via INTRAVENOUS
  Filled 2019-08-16: qty 100

## 2019-08-16 MED ORDER — POTASSIUM CHLORIDE IN NACL 20-0.9 MEQ/L-% IV SOLN
INTRAVENOUS | Status: DC
  Filled 2019-08-16 (×4): qty 1000

## 2019-08-16 MED ORDER — ONDANSETRON HCL 4 MG/2ML IJ SOLN
4.0000 mg | Freq: Four times a day (QID) | INTRAMUSCULAR | Status: DC | PRN
  Administered 2019-08-16: 01:00:00 4 mg via INTRAVENOUS
  Filled 2019-08-16: qty 2

## 2019-08-16 MED ORDER — ONDANSETRON HCL 4 MG/2ML IJ SOLN
4.0000 mg | Freq: Four times a day (QID) | INTRAMUSCULAR | Status: DC | PRN
  Administered 2019-08-17 (×2): 4 mg via INTRAVENOUS
  Filled 2019-08-16 (×2): qty 2

## 2019-08-16 MED ORDER — METHOCARBAMOL 1000 MG/10ML IJ SOLN
1000.0000 mg | Freq: Three times a day (TID) | INTRAVENOUS | Status: DC
  Administered 2019-08-16 – 2019-08-18 (×8): 1000 mg via INTRAVENOUS
  Filled 2019-08-16 (×11): qty 10

## 2019-08-16 MED ORDER — TETANUS-DIPHTH-ACELL PERTUSSIS 5-2.5-18.5 LF-MCG/0.5 IM SUSP
INTRAMUSCULAR | Status: AC
  Filled 2019-08-16: qty 0.5

## 2019-08-16 MED ORDER — MORPHINE SULFATE (PF) 4 MG/ML IV SOLN
4.0000 mg | INTRAVENOUS | Status: DC | PRN
  Administered 2019-08-16 – 2019-08-17 (×4): 4 mg via INTRAVENOUS
  Filled 2019-08-16 (×5): qty 1

## 2019-08-16 MED ORDER — MORPHINE SULFATE (PF) 2 MG/ML IV SOLN: 2.0000 mg | INTRAVENOUS | Status: DC | PRN

## 2019-08-16 MED ORDER — OXYCODONE HCL 5 MG PO TABS
5.0000 mg | ORAL_TABLET | ORAL | Status: DC | PRN
  Administered 2019-08-16 (×2): 10 mg
  Filled 2019-08-16 (×2): qty 2

## 2019-08-16 MED ORDER — SODIUM CHLORIDE 0.9 % IV SOLN
INTRAVENOUS | Status: DC | PRN
  Administered 2019-08-16 – 2019-08-18 (×2): 250 mL via INTRAVENOUS

## 2019-08-16 MED ORDER — OXYCODONE HCL 5 MG PO TABS
5.0000 mg | ORAL_TABLET | ORAL | Status: DC | PRN
  Administered 2019-08-16 – 2019-08-18 (×7): 10 mg via ORAL
  Filled 2019-08-16 (×7): qty 2

## 2019-08-16 MED ORDER — HYDROCODONE-ACETAMINOPHEN 10-325 MG PO TABS: 1.0000 | ORAL_TABLET | ORAL | Status: DC | PRN

## 2019-08-16 MED ORDER — FENTANYL CITRATE (PF) 100 MCG/2ML IJ SOLN
100.0000 ug | Freq: Once | INTRAMUSCULAR | Status: AC
  Administered 2019-08-16: 01:00:00 100 ug via INTRAVENOUS
  Filled 2019-08-16: qty 2
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
## 2019-08-16 NOTE — ED Notes (Signed)
Pt returned to room from CT
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
## 2019-08-16 NOTE — Progress Notes (Signed)
SLP Cancellation Note  Patient Details Name: Bruce Shields MRN: [id number] DOB: [birth year]   Cancelled treatment:        Pt refused evaluation. He feels his speech/language are at baseline. RN reports pt is oriented, alert, no concerns. He has a very bad headache at this time. ST will sign off at this time please re-consult if evaluation is needed.    Wynelle Bourgeois., MA, CCC-SLP 08/16/2019, 9:41 AM
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
## 2019-08-17 ENCOUNTER — Inpatient Hospital Stay (HOSPITAL_COMMUNITY): Payer: Medicaid Other

## 2019-08-17 MED ORDER — HYDROCODONE-ACETAMINOPHEN 5-325 MG PO TABS: 1.0000 | ORAL_TABLET | ORAL | 0 refills | Status: AC | PRN

## 2019-08-17 MED ORDER — MUPIROCIN 2 % EX OINT
TOPICAL_OINTMENT | CUTANEOUS | Status: AC
  Filled 2019-08-17: qty 22

## 2019-08-17 MED ORDER — BACITRACIN ZINC 500 UNIT/GM EX OINT
TOPICAL_OINTMENT | Freq: Two times a day (BID) | CUTANEOUS | Status: DC
  Filled 2019-08-17: qty 28.4

## 2019-08-17 MED ORDER — WHITE PETROLATUM EX OINT
TOPICAL_OINTMENT | CUTANEOUS | Status: AC
  Filled 2019-08-17: qty 28.35

## 2019-08-17 NOTE — Progress Notes (Signed)
PT Cancellation Note  Patient Details Name: Bruce Shields MRN: [id number] DOB: 08-28-1990   Cancelled Treatment:    Reason Eval/Treat Not Completed: Patient at procedure or test/unavailable;Patient declined, no reason specified  Attempted to see early in day due to ?discharge planned. Pt initially gone for CT, then working with OT, and final attempt he refused due to "not feeling well."    Arby Barrette, PT Pager [phone]   Rexanne Mano 08/17/2019, 5:35 PM

## 2019-08-17 NOTE — Discharge Instructions (Signed)
Remove dressing 08/18/2019  Can shower 08/18/2019. Avoid soap on sutures, dry well.  Epidural Hemorrhage  An epidural hemorrhage is bleeding between the inside of the skull and the outer membrane that covers the brain (dura mater). As the bleeding increases, more pressure is put on the brain. This can cause the affected part of the brain to stop working and can lead to disability and death. An epidural hemorrhage is a type of traumatic brain injury (TBI) that is a medical emergency. This condition is also called epidural hematoma. What are the causes? This condition is caused by bleeding from a broken (ruptured) blood vessel. In most cases, a blood vessel ruptures and bleeds due to an injury to the head. Head injuries can happen as a result of car accidents, falls, assaults, or sports. Less commonly, an epidural hemorrhage can happen without head trauma. In these cases, bleeding may be caused by infection, a tumor, or a bleeding disorder. What increases the risk? You are more likely to develop this condition if you:  Participate in sports such as boxing, football, soccer, hockey, auto or bike racing, downhill skiing, and horseback riding.  Take risks while driving, such as speeding.  Are at risk for falls.  Take blood thinners.  Have a bleeding disorder.  Abuse drugs or alcohol. What are the signs or symptoms? Symptoms of this condition may include:  A severe headache or a headache that steadily gets worse.  Nausea or vomiting.  Sensitivity to light.  Dizziness.  Drowsiness.  Loss of consciousness.  Confusion.  Changes in speech or difficulty speaking.  Not being able to move an arm or a leg on one side of the body.  Seizures. Symptoms may develop immediately or several hours after a head injury. How is this diagnosed? This condition may be diagnosed based on your symptoms, your medical history, and a physical exam. You may have tests, including:  Blood  tests.  Imaging tests, such as: ? CT scan. ? MRI. How is this treated? This condition is a medical emergency that must be treated in a hospital immediately. Treatment depends on the cause, severity, and duration of your symptoms. Treatment may include:  Medicines that: ? Lower blood pressure (antihypertensives). ? Relieve pain (analgesics). ? Relieve nausea and vomiting. ? Control seizures. ? Reduce swelling in the brain. ? Control bleeding.  Monitoring your blood pressure.  Assistance with breathing (ventilation).  Getting donated blood products through an IV (transfusion). You may receive cells that help your blood clot.  Placing a tube (shunt) in the brain to relieve pressure.  Performing surgery. This may be needed to stop bleeding, remove blood, or reduce pressure on the brain. Follow these instructions at home: Activity  Avoid situations where you could injure your head again, such as in competitive sports, downhill snow sports, and horseback riding. Do not do these activities until your health care provider approves.  If you play a contact sport and you experience a head injury, follow advice from your health care provider about when you can return to the sport.  Rest. This helps the brain heal. Make sure you: ? Get plenty of sleep. Avoid staying up late at night. ? Keep a consistent sleep schedule. Try to go to sleep and wake up at about the same time every day.  Try to avoid activities that cause physical or mental stress. Return to work or school as told by your health care provider.  Do not drive, ride a bike, or use heavy  machinery until your health care provider approves.  Do not lift anything that is heavier than 5 lb (2.3 kg), or the limit that you are told, until your health care provider says that it is safe. Alcohol use  Do not drink alcohol if: ? Your health care provider tells you not to drink. ? You are pregnant, may be pregnant, or are planning to  become pregnant.  If you drink alcohol, limit how much you use to: ? 0-1 drink a day for women. ? 0-2 drinks a day for men.  Be aware of how much alcohol is in your drink. In the [place] one drink equals one 12 oz bottle of beer (355 mL), one 5 oz glass of wine (148 mL), or one 1 oz glass of hard liquor (44 mL). General instructions  Monitor your symptoms and ask people around you to do the same.  Take over-the-counter and prescription medicines only as told by your health care provider. Do not take blood thinners or NSAIDs unless your health care provider approves. These include warfarin, aspirin, ibuprofen, and naproxen.  Talk with your health care provider about what to expect during your recovery.  Work with your rehabilitation specialists as told by your health care provider. Recovery from brain injuries varies.  Keep all follow-up visits as told by your health care provider. This is important. How is this prevented?  Wear protective gear, such as a helmet, when participating in activities such as biking or contact sports.  Always wear a seat belt when you are in a motor vehicle.  Keep your home environment safe and clear of clutter to reduce the risk of falling. Where to find more information Brain Injury Association of [employer]: www.biausa.org Contact a health care provider if:  You develop any of the following symptoms after your injury: ? Headaches that keep coming back. ? Dizziness or balance problems. ? Nausea. ? Vision problems. ? Increased sensitivity to noise or light. ? Depression or mood swings. ? Anxiety or irritability. ? Memory problems. ? Difficulty concentrating or paying attention. ? Sleep problems. ? Feeling tired all the time. Get help right away if:  You are taking blood thinners and you fall or experience minor trauma to the head.  You have a bleeding disorder and you fall or experience minor trauma to the head.  You develop any of the following  symptoms after a head injury: ? A severe headache or a headache that steadily gets worse. ? Nausea or vomiting. ? Changes in speech or difficulty speaking. ? Seizure. ? Drowsiness or a decrease in alertness. ? Dizziness. ? Loss of consciousness. ? Confusion. ? Inability to move your arm or your leg on one side of your body. These symptoms may represent a serious problem that is an emergency. Do not wait to see if the symptoms will go away. Get medical help right away. Call your local emergency services (911 in the [place] Do not drive yourself to the hospital. Summary  An epidural hemorrhage is bleeding between the inside of the skull and the outer membrane that covers the brain (dura mater). This injury often happens due to a head injury.  An epidural hemorrhage is a medical emergency that can result in severe disability or death if not treated immediately.  Treatment depends on the cause, severity, and duration of your symptoms.  Work with your rehabilitation specialists as told by your health care provider. Recovery from brain injuries varies. This information is not intended to replace advice given  to you by your health care provider. Make sure you discuss any questions you have with your health care provider. Document Revised: 11/06/2018 Document Reviewed: 08/07/2018 Elsevier Patient Education  [employer].
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
## 2019-08-17 NOTE — Progress Notes (Signed)
Pt returned from CT, VS and neuro exam stable
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
## 2019-08-17 NOTE — Progress Notes (Signed)
Pt to CT with transport Ebony Hail. Ticket to Ride completed.

## 2019-08-18 ENCOUNTER — Encounter: Payer: Self-pay | Admitting: Family Medicine

## 2019-08-18 NOTE — Progress Notes (Signed)
Pt was provided discharge instructions with both mother and father present. Pt was transported via wheelchair to waiting car. Both patient and parents understood all instructions.  Hiram Gash RN
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
## 2022-02-26 ENCOUNTER — Ambulatory Visit (HOSPITAL_COMMUNITY)
Admission: EM | Admit: 2022-02-26 | Discharge: 2022-02-26 | Disposition: A | Discharge status: Home / Self Care | Payer: Medicaid Other | Attending: Internal Medicine | Admitting: Internal Medicine

## 2022-02-26 DIAGNOSIS — Z20822 Contact with and (suspected) exposure to covid-19: Secondary | ICD-10-CM | POA: Diagnosis not present

## 2022-02-26 DIAGNOSIS — J029 Acute pharyngitis, unspecified: Secondary | ICD-10-CM | POA: Diagnosis present

## 2022-02-26 DIAGNOSIS — Z79899 Other long term (current) drug therapy: Secondary | ICD-10-CM | POA: Insufficient documentation

## 2022-02-26 DIAGNOSIS — J069 Acute upper respiratory infection, unspecified: Secondary | ICD-10-CM | POA: Insufficient documentation

## 2022-02-26 LAB — POCT RAPID STREP A, ED / UC: Streptococcus, Group A Screen (Direct): NEGATIVE

## 2022-02-26 MED ORDER — ACETAMINOPHEN 500 MG PO TABS: 1000.0000 mg | ORAL_TABLET | Freq: Four times a day (QID) | ORAL | 0 refills | Status: DC | PRN

## 2022-02-26 MED ORDER — GUAIFENESIN ER 1200 MG PO TB12: 1200.0000 mg | ORAL_TABLET | Freq: Two times a day (BID) | ORAL | 0 refills | Status: DC

## 2022-02-26 MED ORDER — IBUPROFEN 600 MG PO TABS: 600.0000 mg | ORAL_TABLET | Freq: Four times a day (QID) | ORAL | 0 refills | Status: DC | PRN

## 2022-02-26 MED ORDER — IBUPROFEN 800 MG PO TABS
ORAL_TABLET | ORAL | Status: AC
  Filled 2022-02-26: qty 1

## 2022-02-26 MED ORDER — IBUPROFEN 800 MG PO TABS
800.0000 mg | ORAL_TABLET | Freq: Once | ORAL | Status: AC
  Administered 2022-02-26: 800 mg via ORAL

## 2022-02-26 NOTE — ED Triage Notes (Signed)
Pt reports sore throat and body aches x 2 days.

## 2022-02-26 NOTE — Discharge Instructions (Addendum)
You have a viral upper respiratory infection.   You may take tylenol 1,'000mg'$  and ibuprofen '600mg'$  every 6 hours with food as needed for fever/chills, sore throat, aches/pains, and inflammation associated with viral illness. Take this with food to avoid stomach upset.    Your next dose of tylenol may be when you get home.  Your next dose of ibuprofen may be at 8pm tonight.   You may do salt water and baking soda gargles every 4 hours as needed for your throat pain.  Please put 1 teaspoon of salt and 1/2 teaspoon of baking soda in 8 ounces of warm water then gargle and spit the water out. You may also put 1 tablespoon of honey in warm water and drink this to soothe your throat.  Place a humidifier in your room at night to help decrease dry air that can irritate your airway and cause you to have a sore throat and cough.  Please try to eat a well-balanced diet while you are sick so that your body gets proper nutrition to heal.  Take guaifenesin '1200mg'$   2 times daily to thin your mucous so that you can cough it up and blow it out of your nose easier if you develop a stuffy nose or cough. Drink plenty of water while taking this medication so that it works well in your body (at least 8 cups a day).   Your COVID testing is pending. Wear a mask and stay away from the public until this results. Work note is at the end of your packet.  If you develop any new or worsening symptoms, please return.  If your symptoms are severe, please go to the emergency room.  Follow-up with your primary care provider for further evaluation and management of your symptoms as well as ongoing wellness visits.  I hope you feel better!
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
## 2022-02-27 LAB — SARS CORONAVIRUS 2 (TAT 6-24 HRS): SARS Coronavirus 2: NEGATIVE

## 2022-02-28 LAB — CULTURE, GROUP A STREP (THRC)

## 2022-12-30 ENCOUNTER — Other Ambulatory Visit: Payer: Self-pay

## 2022-12-30 ENCOUNTER — Emergency Department (HOSPITAL_COMMUNITY): Payer: Medicaid Other

## 2022-12-30 ENCOUNTER — Encounter (HOSPITAL_COMMUNITY): Admission: EM | Disposition: A | Discharge status: Home / Self Care | Payer: Self-pay | Source: Home / Self Care

## 2022-12-30 ENCOUNTER — Inpatient Hospital Stay (HOSPITAL_COMMUNITY)
Admission: EM | Admit: 2022-12-30 | Discharge: 2023-01-05 | DRG: 907 | Disposition: A | Discharge status: Home / Self Care | Payer: Medicaid Other | Attending: Surgery | Admitting: Surgery

## 2022-12-30 ENCOUNTER — Inpatient Hospital Stay (HOSPITAL_COMMUNITY): Payer: Medicaid Other | Admitting: Certified Registered Nurse Anesthetist

## 2022-12-30 ENCOUNTER — Inpatient Hospital Stay (HOSPITAL_COMMUNITY): Payer: Medicaid Other

## 2022-12-30 ENCOUNTER — Encounter (HOSPITAL_COMMUNITY): Payer: Self-pay | Admitting: Emergency Medicine

## 2022-12-30 DIAGNOSIS — W3400XA Accidental discharge from unspecified firearms or gun, initial encounter: Secondary | ICD-10-CM

## 2022-12-30 DIAGNOSIS — Y249XXA Unspecified firearm discharge, undetermined intent, initial encounter: Secondary | ICD-10-CM

## 2022-12-30 DIAGNOSIS — I7123 Aneurysm of the descending thoracic aorta, without rupture: Secondary | ICD-10-CM | POA: Diagnosis present

## 2022-12-30 DIAGNOSIS — N179 Acute kidney failure, unspecified: Secondary | ICD-10-CM | POA: Diagnosis not present

## 2022-12-30 DIAGNOSIS — S2500XA Unspecified injury of thoracic aorta, initial encounter: Secondary | ICD-10-CM

## 2022-12-30 DIAGNOSIS — Z87828 Personal history of other (healed) physical injury and trauma: Secondary | ICD-10-CM

## 2022-12-30 DIAGNOSIS — S2509XA Other specified injury of thoracic aorta, initial encounter: Secondary | ICD-10-CM | POA: Diagnosis present

## 2022-12-30 DIAGNOSIS — J9690 Respiratory failure, unspecified, unspecified whether with hypoxia or hypercapnia: Secondary | ICD-10-CM | POA: Diagnosis present

## 2022-12-30 DIAGNOSIS — S2502XA Major laceration of thoracic aorta, initial encounter: Principal | ICD-10-CM | POA: Diagnosis present

## 2022-12-30 DIAGNOSIS — S27331A Laceration of lung, unilateral, initial encounter: Secondary | ICD-10-CM | POA: Diagnosis present

## 2022-12-30 DIAGNOSIS — G4700 Insomnia, unspecified: Secondary | ICD-10-CM | POA: Diagnosis not present

## 2022-12-30 DIAGNOSIS — S21442A Puncture wound with foreign body of left back wall of thorax with penetration into thoracic cavity, initial encounter: Secondary | ICD-10-CM | POA: Diagnosis present

## 2022-12-30 DIAGNOSIS — Z9889 Other specified postprocedural states: Secondary | ICD-10-CM

## 2022-12-30 DIAGNOSIS — S271XXA Traumatic hemothorax, initial encounter: Secondary | ICD-10-CM | POA: Diagnosis present

## 2022-12-30 DIAGNOSIS — I9788 Other intraoperative complications of the circulatory system, not elsewhere classified: Secondary | ICD-10-CM | POA: Diagnosis not present

## 2022-12-30 DIAGNOSIS — F10129 Alcohol abuse with intoxication, unspecified: Secondary | ICD-10-CM | POA: Diagnosis present

## 2022-12-30 DIAGNOSIS — Z23 Encounter for immunization: Secondary | ICD-10-CM | POA: Diagnosis not present

## 2022-12-30 DIAGNOSIS — T82858A Stenosis of vascular prosthetic devices, implants and grafts, initial encounter: Secondary | ICD-10-CM | POA: Diagnosis not present

## 2022-12-30 DIAGNOSIS — T82898A Other specified complication of vascular prosthetic devices, implants and grafts, initial encounter: Secondary | ICD-10-CM | POA: Diagnosis not present

## 2022-12-30 DIAGNOSIS — F430 Acute stress reaction: Secondary | ICD-10-CM | POA: Diagnosis not present

## 2022-12-30 DIAGNOSIS — Y838 Other surgical procedures as the cause of abnormal reaction of the patient, or of later complication, without mention of misadventure at the time of the procedure: Secondary | ICD-10-CM | POA: Diagnosis not present

## 2022-12-30 DIAGNOSIS — F22 Delusional disorders: Secondary | ICD-10-CM | POA: Diagnosis not present

## 2022-12-30 DIAGNOSIS — F419 Anxiety disorder, unspecified: Secondary | ICD-10-CM | POA: Diagnosis not present

## 2022-12-30 DIAGNOSIS — K449 Diaphragmatic hernia without obstruction or gangrene: Secondary | ICD-10-CM | POA: Diagnosis present

## 2022-12-30 DIAGNOSIS — S21242A Puncture wound with foreign body of left back wall of thorax without penetration into thoracic cavity, initial encounter: Secondary | ICD-10-CM | POA: Diagnosis present

## 2022-12-30 DIAGNOSIS — Y813 Surgical instruments, materials and general- and plastic-surgery devices (including sutures) associated with adverse incidents: Secondary | ICD-10-CM | POA: Diagnosis not present

## 2022-12-30 HISTORY — DX: Unspecified convulsions: R56.9

## 2022-12-30 HISTORY — PX: AORTOGRAM: SHX6300

## 2022-12-30 HISTORY — PX: PATCH ANGIOPLASTY: SHX6230

## 2022-12-30 HISTORY — PX: THORACIC AORTIC ENDOVASCULAR STENT GRAFT: SHX6112

## 2022-12-30 HISTORY — DX: Accidental discharge from unspecified firearms or gun, initial encounter: W34.00XA

## 2022-12-30 HISTORY — PX: ULTRASOUND GUIDANCE FOR VASCULAR ACCESS: SHX6516

## 2022-12-30 HISTORY — DX: Migraine, unspecified, not intractable, without status migrainosus: G43.909

## 2022-12-30 LAB — I-STAT CHEM 8, ED
BUN: 12 mg/dL (ref 6–20)
Calcium, Ion: 1.04 mmol/L — ABNORMAL LOW (ref 1.15–1.40)
Chloride: 105 mmol/L (ref 98–111)
Creatinine, Ser: 1.8 mg/dL — ABNORMAL HIGH (ref 0.61–1.24)
Glucose, Bld: 127 mg/dL — ABNORMAL HIGH (ref 70–99)
HCT: 46 % (ref 39.0–52.0)
Hemoglobin: 15.6 g/dL (ref 13.0–17.0)
Potassium: 3.3 mmol/L — ABNORMAL LOW (ref 3.5–5.1)
Sodium: 142 mmol/L (ref 135–145)
TCO2: 21 mmol/L — ABNORMAL LOW (ref 22–32)

## 2022-12-30 LAB — COMPREHENSIVE METABOLIC PANEL
ALT: 28 U/L (ref 0–44)
AST: 35 U/L (ref 15–41)
Albumin: 4 g/dL (ref 3.5–5.0)
Alkaline Phosphatase: 55 U/L (ref 38–126)
Anion gap: 13 (ref 5–15)
BUN: 11 mg/dL (ref 6–20)
CO2: 20 mmol/L — ABNORMAL LOW (ref 22–32)
Calcium: 8.6 mg/dL — ABNORMAL LOW (ref 8.9–10.3)
Chloride: 105 mmol/L (ref 98–111)
Creatinine, Ser: 1.44 mg/dL — ABNORMAL HIGH (ref 0.61–1.24)
GFR, Estimated: >60 mL/min (ref >60)
Glucose, Bld: 129 mg/dL — ABNORMAL HIGH (ref 70–99)
Potassium: 3.3 mmol/L — ABNORMAL LOW (ref 3.5–5.1)
Sodium: 138 mmol/L (ref 135–145)
Total Bilirubin: 0.4 mg/dL (ref 0.3–1.2)
Total Protein: 7.4 g/dL (ref 6.5–8.1)

## 2022-12-30 LAB — I-STAT ARTERIAL BLOOD GAS, ED
Acid-base deficit: 4 mmol/L — ABNORMAL HIGH (ref 0.0–2.0)
Bicarbonate: 22.7 mmol/L (ref 20.0–28.0)
Calcium, Ion: 1.15 mmol/L (ref 1.15–1.40)
HCT: 47 % (ref 39.0–52.0)
Hemoglobin: 16 g/dL (ref 13.0–17.0)
O2 Saturation: 100 %
Patient temperature: 98.6
Potassium: 4.4 mmol/L (ref 3.5–5.1)
Sodium: 139 mmol/L (ref 135–145)
TCO2: 24 mmol/L (ref 22–32)
pCO2 arterial: 47.8 mmHg (ref 32–48)
pH, Arterial: 7.286 — ABNORMAL LOW (ref 7.35–7.45)
pO2, Arterial: 562 mmHg — ABNORMAL HIGH (ref 83–108)

## 2022-12-30 LAB — POCT I-STAT 7, (LYTES, BLD GAS, ICA,H+H)
Acid-base deficit: 5 mmol/L — ABNORMAL HIGH (ref 0.0–2.0)
Bicarbonate: 20.4 mmol/L (ref 20.0–28.0)
Calcium, Ion: 1.11 mmol/L — ABNORMAL LOW (ref 1.15–1.40)
HCT: 42 % (ref 39.0–52.0)
Hemoglobin: 14.3 g/dL (ref 13.0–17.0)
O2 Saturation: 100 %
Patient temperature: 35.7
Potassium: 4 mmol/L (ref 3.5–5.1)
Sodium: 139 mmol/L (ref 135–145)
TCO2: 21 mmol/L — ABNORMAL LOW (ref 22–32)
pCO2 arterial: 34.7 mmHg (ref 32–48)
pH, Arterial: 7.37 (ref 7.35–7.45)
pO2, Arterial: 445 mmHg — ABNORMAL HIGH (ref 83–108)

## 2022-12-30 LAB — LACTIC ACID, PLASMA: Lactic Acid, Venous: 3.7 mmol/L (ref 0.5–1.9)

## 2022-12-30 LAB — MRSA NEXT GEN BY PCR, NASAL: MRSA by PCR Next Gen: NOT DETECTED

## 2022-12-30 LAB — CBC
HCT: 44.6 % (ref 39.0–52.0)
Hemoglobin: 15 g/dL (ref 13.0–17.0)
MCH: 30.2 pg (ref 26.0–34.0)
MCHC: 33.6 g/dL (ref 30.0–36.0)
MCV: 89.7 fL (ref 80.0–100.0)
Platelets: 235 10*3/uL (ref 150–400)
RBC: 4.97 MIL/uL (ref 4.22–5.81)
RDW: 12.4 % (ref 11.5–15.5)
WBC: 7.8 10*3/uL (ref 4.0–10.5)
nRBC: 0 % (ref 0.0–0.2)

## 2022-12-30 LAB — PREPARE RBC (CROSSMATCH)

## 2022-12-30 LAB — PROTIME-INR
INR: 1.1 (ref 0.8–1.2)
Prothrombin Time: 14.5 seconds (ref 11.4–15.2)

## 2022-12-30 LAB — POCT ACTIVATED CLOTTING TIME: Activated Clotting Time: 220 s

## 2022-12-30 LAB — BPAM RBC
Blood Product Expiration Date: 202406262359
ISSUE DATE / TIME: 202406020724
Unit Type and Rh: 5100
Unit Type and Rh: 5100
Unit Type and Rh: 5100
Unit Type and Rh: 5100

## 2022-12-30 LAB — TYPE AND SCREEN
Antibody Screen: NEGATIVE
Unit division: 0
Unit division: 0
Unit division: 0
Unit division: 0
Unit division: 0

## 2022-12-30 LAB — ABO/RH: ABO/RH(D): O POS

## 2022-12-30 LAB — ETHANOL: Alcohol, Ethyl (B): 208 mg/dL — ABNORMAL HIGH (ref <10)

## 2022-12-30 SURGERY — INSERTION, ENDOVASCULAR STENT GRAFT, AORTA, THORACIC
Anesthesia: General | Site: Groin | Laterality: Right

## 2022-12-30 MED ORDER — FENTANYL CITRATE PF 50 MCG/ML IJ SOSY: 50.0000 ug | PREFILLED_SYRINGE | Freq: Once | INTRAMUSCULAR | Status: DC

## 2022-12-30 MED ORDER — PROTAMINE SULFATE 10 MG/ML IV SOLN
INTRAVENOUS | Status: AC
  Filled 2022-12-30: qty 5

## 2022-12-30 MED ORDER — 0.9 % SODIUM CHLORIDE (POUR BTL) OPTIME
TOPICAL | Status: DC | PRN
  Administered 2022-12-30: 1000 mL

## 2022-12-30 MED ORDER — PROPOFOL 1000 MG/100ML IV EMUL
10.0000 ug/kg/min | INTRAVENOUS | Status: DC
  Administered 2022-12-30: 40 ug/kg/min via INTRAVENOUS

## 2022-12-30 MED ORDER — HEPARIN SODIUM (PORCINE) 1000 UNIT/ML IJ SOLN
INTRAMUSCULAR | Status: AC
  Filled 2022-12-30: qty 10

## 2022-12-30 MED ORDER — CHLORHEXIDINE GLUCONATE CLOTH 2 % EX PADS
6.0000 | MEDICATED_PAD | Freq: Every day | CUTANEOUS | Status: DC
  Administered 2022-12-30 – 2023-01-04 (×6): 6 via TOPICAL

## 2022-12-30 MED ORDER — IOHEXOL 300 MG/ML  SOLN
50.0000 mL | Freq: Once | INTRAMUSCULAR | Status: AC | PRN
  Administered 2022-12-30: 100 mL

## 2022-12-30 MED ORDER — POLYETHYLENE GLYCOL 3350 17 G PO PACK: 17.0000 g | PACK | Freq: Every day | ORAL | Status: DC

## 2022-12-30 MED ORDER — PHENYLEPHRINE HCL-NACL 20-0.9 MG/250ML-% IV SOLN
INTRAVENOUS | Status: DC | PRN
  Administered 2022-12-30: 25 ug/min via INTRAVENOUS

## 2022-12-30 MED ORDER — HYDRALAZINE HCL 20 MG/ML IJ SOLN: 10.0000 mg | INTRAMUSCULAR | Status: DC | PRN

## 2022-12-30 MED ORDER — ROCURONIUM BROMIDE 100 MG/10ML IV SOLN
INTRAVENOUS | Status: DC | PRN
  Administered 2022-12-30 (×2): 50 mg via INTRAVENOUS
  Administered 2022-12-30: 100 mg via INTRAVENOUS

## 2022-12-30 MED ORDER — FENTANYL BOLUS VIA INFUSION: 50.0000 ug | INTRAVENOUS | Status: DC | PRN

## 2022-12-30 MED ORDER — MIDAZOLAM HCL (PF) 5 MG/ML IJ SOLN: 2.0000 mg | INTRAMUSCULAR | Status: DC | PRN

## 2022-12-30 MED ORDER — CEFAZOLIN SODIUM-DEXTROSE 2-3 GM-%(50ML) IV SOLR
INTRAVENOUS | Status: DC | PRN
  Administered 2022-12-30: 2 g via INTRAVENOUS

## 2022-12-30 MED ORDER — CEFAZOLIN SODIUM-DEXTROSE 2-4 GM/100ML-% IV SOLN
2.0000 g | Freq: Once | INTRAVENOUS | Status: AC
  Administered 2022-12-30: 2 g via INTRAVENOUS

## 2022-12-30 MED ORDER — SODIUM CHLORIDE 0.9% IV SOLUTION: Freq: Once | INTRAVENOUS | Status: DC

## 2022-12-30 MED ORDER — FENTANYL 2500MCG IN NS 250ML (10MCG/ML) PREMIX INFUSION
50.0000 ug/h | INTRAVENOUS | Status: DC
  Administered 2022-12-30: 200 ug/h via INTRAVENOUS
  Administered 2022-12-30: 100 ug/h via INTRAVENOUS
  Filled 2022-12-30: qty 250

## 2022-12-30 MED ORDER — DOCUSATE SODIUM 50 MG/5ML PO LIQD
100.0000 mg | Freq: Two times a day (BID) | ORAL | Status: DC
  Administered 2022-12-30: 100 mg
  Filled 2022-12-30: qty 10

## 2022-12-30 MED ORDER — ENOXAPARIN SODIUM 30 MG/0.3ML IJ SOSY: 30.0000 mg | PREFILLED_SYRINGE | Freq: Two times a day (BID) | INTRAMUSCULAR | Status: DC

## 2022-12-30 MED ORDER — PROPOFOL 1000 MG/100ML IV EMUL
10.0000 ug/kg/min | INTRAVENOUS | Status: DC
  Administered 2022-12-30: 10 ug/kg/min via INTRAVENOUS

## 2022-12-30 MED ORDER — FENTANYL 2500MCG IN NS 250ML (10MCG/ML) PREMIX INFUSION
50.0000 ug/h | INTRAVENOUS | Status: DC
  Administered 2022-12-30: 50 ug/h via INTRAVENOUS
  Filled 2022-12-30: qty 250

## 2022-12-30 MED ORDER — PROPOFOL 1000 MG/100ML IV EMUL
0.0000 ug/kg/min | INTRAVENOUS | Status: DC
  Administered 2022-12-30 (×4): 50 ug/kg/min via INTRAVENOUS
  Administered 2022-12-30 – 2022-12-31 (×2): 25 ug/kg/min via INTRAVENOUS
  Filled 2022-12-30 (×4): qty 100

## 2022-12-30 MED ORDER — MIDAZOLAM HCL 2 MG/2ML IJ SOLN
INTRAMUSCULAR | Status: AC
  Filled 2022-12-30: qty 2

## 2022-12-30 MED ORDER — ORAL CARE MOUTH RINSE: 15.0000 mL | OROMUCOSAL | Status: DC | PRN

## 2022-12-30 MED ORDER — MIDAZOLAM HCL 5 MG/5ML IJ SOLN
INTRAMUSCULAR | Status: DC | PRN
  Administered 2022-12-30: 2 mg via INTRAVENOUS

## 2022-12-30 MED ORDER — ONDANSETRON 4 MG PO TBDP: 4.0000 mg | ORAL_TABLET | Freq: Four times a day (QID) | ORAL | Status: DC | PRN

## 2022-12-30 MED ORDER — LACTATED RINGERS IV SOLN: INTRAVENOUS | Status: DC | PRN

## 2022-12-30 MED ORDER — IODIXANOL 320 MG/ML IV SOLN
INTRAVENOUS | Status: DC | PRN
  Administered 2022-12-30: 50 mL

## 2022-12-30 MED ORDER — MIDAZOLAM HCL 2 MG/2ML IJ SOLN
2.0000 mg | INTRAMUSCULAR | Status: DC | PRN
  Administered 2022-12-30: 2 mg via INTRAVENOUS
  Filled 2022-12-30: qty 2

## 2022-12-30 MED ORDER — SUCCINYLCHOLINE CHLORIDE 20 MG/ML IJ SOLN
INTRAMUSCULAR | Status: AC | PRN
  Administered 2022-12-30: 200 mg via INTRAVENOUS

## 2022-12-30 MED ORDER — SODIUM CHLORIDE 0.9 % IV SOLN
INTRAVENOUS | Status: AC | PRN
  Administered 2022-12-30: 1000 mL via INTRAVENOUS
  Administered 2022-12-30: 125 mL/h via INTRAVENOUS

## 2022-12-30 MED ORDER — IOHEXOL 350 MG/ML SOLN
75.0000 mL | Freq: Once | INTRAVENOUS | Status: AC | PRN
  Administered 2022-12-30: 75 mL via INTRAVENOUS

## 2022-12-30 MED ORDER — FENTANYL CITRATE (PF) 100 MCG/2ML IJ SOLN
INTRAMUSCULAR | Status: DC | PRN
  Administered 2022-12-30 (×2): 50 ug via INTRAVENOUS

## 2022-12-30 MED ORDER — FENTANYL CITRATE (PF) 250 MCG/5ML IJ SOLN
INTRAMUSCULAR | Status: AC
  Filled 2022-12-30: qty 5

## 2022-12-30 MED ORDER — ETOMIDATE 2 MG/ML IV SOLN
INTRAVENOUS | Status: AC | PRN
  Administered 2022-12-30: 20 mg via INTRAVENOUS

## 2022-12-30 MED ORDER — HEPARIN 6000 UNIT IRRIGATION SOLUTION
Status: DC | PRN
  Administered 2022-12-30: 2

## 2022-12-30 MED ORDER — PROPOFOL 1000 MG/100ML IV EMUL
INTRAVENOUS | Status: AC
  Filled 2022-12-30: qty 100

## 2022-12-30 MED ORDER — ONDANSETRON HCL 4 MG/2ML IJ SOLN: 4.0000 mg | Freq: Four times a day (QID) | INTRAMUSCULAR | Status: DC | PRN

## 2022-12-30 MED ORDER — HEPARIN SODIUM (PORCINE) 1000 UNIT/ML IJ SOLN
INTRAMUSCULAR | Status: DC | PRN
  Administered 2022-12-30: 3000 [IU] via INTRAVENOUS
  Administered 2022-12-30: 10000 [IU] via INTRAVENOUS

## 2022-12-30 MED ORDER — HEPARIN 6000 UNIT IRRIGATION SOLUTION
Status: AC
  Filled 2022-12-30: qty 500

## 2022-12-30 MED ORDER — SODIUM CHLORIDE 0.9 % IV SOLN: INTRAVENOUS | Status: DC | PRN

## 2022-12-30 MED ORDER — METOPROLOL TARTRATE 5 MG/5ML IV SOLN: 5.0000 mg | Freq: Four times a day (QID) | INTRAVENOUS | Status: DC | PRN

## 2022-12-30 MED ORDER — PANTOPRAZOLE SODIUM 40 MG IV SOLR
40.0000 mg | Freq: Every day | INTRAVENOUS | Status: DC
  Administered 2022-12-30: 40 mg via INTRAVENOUS
  Filled 2022-12-30: qty 10

## 2022-12-30 MED ORDER — ORAL CARE MOUTH RINSE
15.0000 mL | OROMUCOSAL | Status: DC
  Administered 2022-12-30 – 2022-12-31 (×11): 15 mL via OROMUCOSAL

## 2022-12-30 MED ORDER — PROTAMINE SULFATE 10 MG/ML IV SOLN
INTRAVENOUS | Status: DC | PRN
  Administered 2022-12-30 (×2): 20 mg via INTRAVENOUS
  Administered 2022-12-30: 10 mg via INTRAVENOUS

## 2022-12-30 MED ORDER — FENTANYL CITRATE (PF) 100 MCG/2ML IJ SOLN
INTRAMUSCULAR | Status: AC
  Filled 2022-12-30: qty 2

## 2022-12-30 MED ORDER — ROCURONIUM BROMIDE 10 MG/ML (PF) SYRINGE
PREFILLED_SYRINGE | INTRAVENOUS | Status: AC
  Filled 2022-12-30: qty 20

## 2022-12-30 MED ORDER — DEXMEDETOMIDINE HCL IN NACL 400 MCG/100ML IV SOLN
0.0000 ug/kg/h | INTRAVENOUS | Status: DC
  Administered 2022-12-30: 0.4 ug/kg/h via INTRAVENOUS
  Administered 2022-12-30 – 2022-12-31 (×2): 0.6 ug/kg/h via INTRAVENOUS
  Administered 2022-12-31 – 2023-01-01 (×3): 1.2 ug/kg/h via INTRAVENOUS
  Filled 2022-12-30 (×6): qty 100

## 2022-12-30 MED ORDER — CEFAZOLIN SODIUM 1 G IJ SOLR
INTRAMUSCULAR | Status: AC
  Filled 2022-12-30: qty 20

## 2022-12-30 MED ORDER — POTASSIUM CHLORIDE IN NACL 20-0.9 MEQ/L-% IV SOLN
INTRAVENOUS | Status: DC
  Filled 2022-12-30 (×4): qty 1000

## 2022-12-30 SURGICAL SUPPLY — 74 items
ADH SKN CLS APL DERMABOND .7 (GAUZE/BANDAGES/DRESSINGS) ×3
BAG COUNTER SPONGE SURGICOUNT (BAG) ×3 IMPLANT
BAG SPNG CNTER NS LX DISP (BAG) ×3
CANISTER SUCT 3000ML PPV (MISCELLANEOUS) ×3 IMPLANT
CATH ACCU-VU SIZ PIG 5F 100CM (CATHETERS) ×1 IMPLANT
CATH VISIONS PV .035 IVUS (CATHETERS) ×1 IMPLANT
CLIP TI MEDIUM 24 (CLIP) ×1 IMPLANT
CLIP TI WIDE RED SMALL 24 (CLIP) ×1 IMPLANT
CLOSURE PERCLOSE PROSTYLE (VASCULAR PRODUCTS) ×6 IMPLANT
COVER PROBE W GEL 5X96 (DRAPES) ×4 IMPLANT
DERMABOND ADVANCED .7 DNX12 (GAUZE/BANDAGES/DRESSINGS) ×3 IMPLANT
DEVICE CLOSURE PERCLS PRGLD 6F (VASCULAR PRODUCTS) IMPLANT
DRAPE INCISE IOBAN 66X45 STRL (DRAPES) ×1 IMPLANT
DRSG TEGADERM 2-3/8X2-3/4 SM (GAUZE/BANDAGES/DRESSINGS) ×3 IMPLANT
ELECT REM PT RETURN 9FT ADLT (ELECTROSURGICAL) ×6
ELECTRODE REM PT RTRN 9FT ADLT (ELECTROSURGICAL) ×6 IMPLANT
GLOVE BIO SURGEON STRL SZ7.5 (GLOVE) ×4 IMPLANT
GLOVE BIOGEL PI IND STRL 8 (GLOVE) ×4 IMPLANT
GOWN STRL REUS W/ TWL LRG LVL3 (GOWN DISPOSABLE) ×9 IMPLANT
GOWN STRL REUS W/ TWL XL LVL3 (GOWN DISPOSABLE) ×3 IMPLANT
GOWN STRL REUS W/TWL LRG LVL3 (GOWN DISPOSABLE) ×9
GOWN STRL REUS W/TWL XL LVL3 (GOWN DISPOSABLE) ×3
KIT BASIN OR (CUSTOM PROCEDURE TRAY) ×3 IMPLANT
KIT MICROPUNCTURE NIT STIFF (SHEATH) ×1 IMPLANT
KIT TURNOVER KIT B (KITS) ×3 IMPLANT
LOOP VASCLR MAXI BLUE 18IN ST (MISCELLANEOUS) IMPLANT
LOOP VASCULAR MAXI 18 BLUE (MISCELLANEOUS) ×3
LOOP VASCULAR MINI 18 RED (MISCELLANEOUS) ×3
LOOPS VASCLR MAXI BLUE 18IN ST (MISCELLANEOUS) ×3 IMPLANT
NDL PERC 18GX7CM (NEEDLE) ×2 IMPLANT
NEEDLE PERC 18GX7CM (NEEDLE) ×3 IMPLANT
NS IRRIG 1000ML POUR BTL (IV SOLUTION) ×6 IMPLANT
PACK ENDOVASCULAR (PACKS) ×3 IMPLANT
PAD ARMBOARD 7.5X6 YLW CONV (MISCELLANEOUS) ×6 IMPLANT
PATCH VASC XENOSURE 1CMX6CM (Vascular Products) ×3 IMPLANT
PATCH VASC XENOSURE 1X6 (Vascular Products) ×1 IMPLANT
PENCIL SMOKE EVACUATOR (MISCELLANEOUS) ×1 IMPLANT
PERCLOSE PROGLIDE 6F (VASCULAR PRODUCTS)
SHEATH AVANTI 11CM 8FR (SHEATH) IMPLANT
SHEATH DRYSEAL FLEX 18FR 33CM (SHEATH) ×1 IMPLANT
SHEATH PINNACLE 8F 10CM (SHEATH) ×1 IMPLANT
SLEEVE ISOL F/PACE RF HD COVER (MISCELLANEOUS) ×1 IMPLANT
STAPLER VISISTAT 35W (STAPLE) IMPLANT
STENT GRFT THORAC ACS 21X21X10 (Endovascular Graft) ×1 IMPLANT
STOPCOCK MORSE 400PSI 3WAY (MISCELLANEOUS) ×4 IMPLANT
SUT ETHILON 3 0 PS 1 (SUTURE) IMPLANT
SUT MNCRL AB 4-0 PS2 18 (SUTURE) ×6 IMPLANT
SUT PROLENE 5 0 C 1 24 (SUTURE) ×1 IMPLANT
SUT PROLENE 5 0 C 1 36 (SUTURE) ×1 IMPLANT
SUT PROLENE 6 0 BV (SUTURE) ×1 IMPLANT
SUT PROLENE 6 0 CC (SUTURE) ×6 IMPLANT
SUT SILK 2 0 (SUTURE) ×3
SUT SILK 2-0 18XBRD TIE 12 (SUTURE) ×1 IMPLANT
SUT SILK 3 0 (SUTURE) ×3
SUT SILK 3-0 18XBRD TIE 12 (SUTURE) ×1 IMPLANT
SUT VIC AB 2-0 CT1 27 (SUTURE) ×3
SUT VIC AB 2-0 CT1 TAPERPNT 27 (SUTURE) ×1 IMPLANT
SUT VIC AB 2-0 CTX 36 (SUTURE) IMPLANT
SUT VIC AB 3-0 SH 27 (SUTURE) ×3
SUT VIC AB 3-0 SH 27X BRD (SUTURE) ×1 IMPLANT
SYR 30ML LL (SYRINGE) IMPLANT
SYR 50ML LL SCALE MARK (SYRINGE) ×3 IMPLANT
SYR BULB IRRIG 60ML STRL (SYRINGE) ×1 IMPLANT
TOWEL GREEN STERILE (TOWEL DISPOSABLE) ×3 IMPLANT
TOWEL GREEN STERILE FF (TOWEL DISPOSABLE) ×1 IMPLANT
TRAY FOLEY MTR SLVR 16FR STAT (SET/KITS/TRAYS/PACK) ×3 IMPLANT
TUBE CONNECTING 12X1/4 (SUCTIONS) ×1 IMPLANT
TUBING HIGH PRESSURE 120CM (CONNECTOR) ×3 IMPLANT
TUBING INJECTOR 48 (MISCELLANEOUS) ×1 IMPLANT
VASCULAR TIE MAXI BLUE 18IN ST (MISCELLANEOUS) ×3
VASCULAR TIE MINI RED 18IN STL (MISCELLANEOUS) ×1 IMPLANT
WIRE BENTSON .035X145CM (WIRE) IMPLANT
WIRE STARTER BENTSON 035X150 (WIRE) ×1 IMPLANT
WIRE STIFF LUNDERQUIST 260CM (WIRE) ×1 IMPLANT

## 2022-12-30 NOTE — ED Notes (Signed)
Patient transported to CT scan . 

## 2022-12-30 NOTE — Progress Notes (Signed)
Transported patient to OR on vent with RN, CRNA placed on anesthesia vent. Servo taken to 4N24 and set up in patient room for after surgery. Report given to receiving RT.

## 2022-12-30 NOTE — ED Notes (Signed)
Dr. Andrey Campanile ( Trauma Surgeon) at bedside inserting left chest tube (pigtail ).
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
## 2022-12-30 NOTE — Transfer of Care (Signed)
Immediate Anesthesia Transfer of Care Note  Patient: Bruce Shields  Procedure(s) Performed: THORACIC AORTIC ENDOVASCULAR STENT GRAFT (Chest) RIGHT COMMON FEMORAL ARTERY CUTDOWN (Right: Groin) ULTRASOUND GUIDANCE FOR VASCULAR ACCESS (Right: Groin) INTRAVASCULAR ULTRASOUND (Right: Groin) THORACIC AORTOGRAM (Right: Groin) PATCH ANGIOPLASTY RIGHT COMMON FEMORAL ARTERY 1CM x 6CM BOVINE PATCH (Right: Groin)  Patient Location: ICU  Anesthesia Type:General  Level of Consciousness: sedated and Patient remains intubated per anesthesia plan  Airway & Oxygen Therapy: Patient remains intubated per anesthesia plan and Patient placed on Ventilator (see vital sign flow sheet for setting)  Post-op Assessment: Report given to RN and Post -op Vital signs reviewed and stable  Post vital signs: Reviewed and stable  Last Vitals:  Vitals Value Taken Time  BP 123/74 12/30/22 0935  Temp 36.6 C 12/30/22 0930  Pulse 86 12/30/22 0946  Resp 16 12/30/22 0946  SpO2 100 % 12/30/22 0946  Vitals shown include unvalidated device data.  Last Pain:  Vitals:   12/30/22 0930  TempSrc: Axillary  PainSc:          Complications: No notable events documented.

## 2022-12-30 NOTE — Progress Notes (Signed)
RT transported Pt from 4N24 to xray and back without any complications. RN at bedside.

## 2022-12-30 NOTE — Progress Notes (Signed)
Pt is currently off the unit, RT will do a vent check when Pt returns.

## 2022-12-30 NOTE — Progress Notes (Signed)
Orthopedic Tech Progress Note Patient Details:  Bruce Shields Nov 28, 1990 [id number]  Patient ID: Bruce Shields, male   DOB: 13-Mar-1991, 32 y.o.   MRN: [id number] I attended trauma page. Bruce Shields 12/30/2022, 3:48 AM
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
## 2022-12-30 NOTE — Anesthesia Procedure Notes (Signed)
Date/Time: 12/30/2022 6:40 AM  Performed by: Nils Pyle, CRNAPre-anesthesia Checklist: Patient identified, Emergency Drugs available, Suction available and Patient being monitored Patient Re-evaluated:Patient Re-evaluated prior to induction Oxygen Delivery Method: Circle system utilized Preoxygenation: Pre-oxygenation with 100% oxygen Induction Type: Inhalational induction with existing ETT Placement Confirmation: positive ETCO2 and breath sounds checked- equal and bilateral Dental Injury: Teeth and Oropharynx as per pre-operative assessment

## 2022-12-30 NOTE — Anesthesia Postprocedure Evaluation (Signed)
Anesthesia Post Note  Patient: Bruce Shields  Procedure(s) Performed: THORACIC AORTIC ENDOVASCULAR STENT GRAFT (Chest) RIGHT COMMON FEMORAL ARTERY CUTDOWN (Right: Groin) ULTRASOUND GUIDANCE FOR VASCULAR ACCESS (Right: Groin) INTRAVASCULAR ULTRASOUND (Right: Groin) THORACIC AORTOGRAM (Right: Groin) PATCH ANGIOPLASTY RIGHT COMMON FEMORAL ARTERY 1CM x 6CM BOVINE PATCH (Right: Groin)     Patient location during evaluation: ICU Anesthesia Type: General Level of consciousness: sedated and patient remains intubated per anesthesia plan Pain management: pain level controlled Vital Signs Assessment: post-procedure vital signs reviewed and stable Respiratory status: patient remains intubated per anesthesia plan and patient on ventilator - see flowsheet for VS Cardiovascular status: blood pressure returned to baseline and stable Postop Assessment: no apparent nausea or vomiting Anesthetic complications: no   No notable events documented.  Last Vitals:  Vitals:   12/30/22 0930 12/30/22 0935  BP:  123/74  Pulse:  93  Resp:  15  Temp: 36.6 C   SpO2:  100%    Last Pain:  Vitals:   12/30/22 0930  TempSrc: Axillary  PainSc:                  Evanell Redlich,E. Nalaysia Manganiello

## 2022-12-30 NOTE — ED Notes (Signed)
Transported to OR
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
## 2022-12-30 NOTE — Progress Notes (Signed)
Patient assessed this afternoon.  Esophagram reviewed- negative for leak or perforation or other abnormality VSS He is resting comfortably on the vent. Abdomen is soft, nondistended, without tenderness though he is sedated. He has palpable pedal pulses.  Will continue to monitor, continue vent for tonight  Berna Bue MD FACS
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
## 2022-12-30 NOTE — Anesthesia Procedure Notes (Signed)
Arterial Line Insertion Start/End6/08/2022 6:47 AM, 12/30/2022 6:53 AM Performed by: Jairo Ben, MD, anesthesiologist  Patient location: OR. Preanesthetic checklist: patient identified, IV checked, monitors and equipment checked, pre-op evaluation, timeout performed and anesthesia consent Left, radial was placed Catheter size: 20 G Hand hygiene performed , maximum sterile barriers used  and Seldinger technique used Allen's test indicative of satisfactory collateral circulation Attempts: 2 Procedure performed without using ultrasound guided technique. Following insertion, dressing applied and Biopatch. Post procedure assessment: normal  Patient tolerated the procedure well with no immediate complications.

## 2022-12-31 ENCOUNTER — Encounter (HOSPITAL_COMMUNITY): Payer: Self-pay | Admitting: Vascular Surgery

## 2022-12-31 ENCOUNTER — Inpatient Hospital Stay (HOSPITAL_COMMUNITY): Payer: Medicaid Other

## 2022-12-31 LAB — CBC
HCT: 41.7 % (ref 39.0–52.0)
Hemoglobin: 14.6 g/dL (ref 13.0–17.0)
MCH: 30.9 pg (ref 26.0–34.0)
MCHC: 35 g/dL (ref 30.0–36.0)
MCV: 88.3 fL (ref 80.0–100.0)
Platelets: 124 10*3/uL — ABNORMAL LOW (ref 150–400)
RBC: 4.72 MIL/uL (ref 4.22–5.81)
RDW: 13.2 % (ref 11.5–15.5)
WBC: 12.1 10*3/uL — ABNORMAL HIGH (ref 4.0–10.5)
nRBC: 0 % (ref 0.0–0.2)

## 2022-12-31 LAB — BASIC METABOLIC PANEL
Anion gap: 9 (ref 5–15)
BUN: 10 mg/dL (ref 6–20)
CO2: 21 mmol/L — ABNORMAL LOW (ref 22–32)
Calcium: 8.4 mg/dL — ABNORMAL LOW (ref 8.9–10.3)
Chloride: 106 mmol/L (ref 98–111)
Creatinine, Ser: 0.98 mg/dL (ref 0.61–1.24)
GFR, Estimated: >60 mL/min (ref >60)
Glucose, Bld: 115 mg/dL — ABNORMAL HIGH (ref 70–99)
Potassium: 4.1 mmol/L (ref 3.5–5.1)
Sodium: 136 mmol/L (ref 135–145)

## 2022-12-31 LAB — BPAM RBC
ISSUE DATE / TIME: 202406020316
ISSUE DATE / TIME: 202406020724
Unit Type and Rh: 5100
Unit Type and Rh: 5100

## 2022-12-31 LAB — TYPE AND SCREEN: ABO/RH(D): O POS

## 2022-12-31 MED ORDER — METHOCARBAMOL 1000 MG/10ML IJ SOLN: 500.0000 mg | Freq: Four times a day (QID) | INTRAVENOUS | Status: DC | PRN

## 2022-12-31 MED ORDER — MORPHINE SULFATE (PF) 2 MG/ML IV SOLN
2.0000 mg | INTRAVENOUS | Status: DC | PRN
  Administered 2022-12-31: 4 mg via INTRAVENOUS
  Administered 2022-12-31 (×2): 2 mg via INTRAVENOUS
  Administered 2022-12-31 – 2023-01-02 (×5): 4 mg via INTRAVENOUS
  Filled 2022-12-31: qty 2
  Filled 2022-12-31: qty 1
  Filled 2022-12-31 (×3): qty 2
  Filled 2022-12-31: qty 1
  Filled 2022-12-31 (×3): qty 2

## 2022-12-31 MED ORDER — ASPIRIN 325 MG PO TABS
325.0000 mg | ORAL_TABLET | Freq: Every day | ORAL | Status: DC
  Administered 2022-12-31 – 2023-01-05 (×6): 325 mg via ORAL
  Filled 2022-12-31 (×6): qty 1

## 2022-12-31 MED ORDER — DOCUSATE SODIUM 100 MG PO CAPS
100.0000 mg | ORAL_CAPSULE | Freq: Two times a day (BID) | ORAL | Status: DC
  Administered 2022-12-31 – 2023-01-04 (×8): 100 mg via ORAL
  Filled 2022-12-31 (×10): qty 1

## 2022-12-31 MED ORDER — POLYETHYLENE GLYCOL 3350 17 G PO PACK
17.0000 g | PACK | Freq: Every day | ORAL | Status: DC
  Administered 2023-01-01 – 2023-01-02 (×2): 17 g via ORAL
  Filled 2022-12-31 (×2): qty 1

## 2022-12-31 MED ORDER — LACTATED RINGERS IV SOLN: INTRAVENOUS | Status: DC

## 2022-12-31 MED ORDER — ENOXAPARIN SODIUM 40 MG/0.4ML IJ SOSY
40.0000 mg | PREFILLED_SYRINGE | Freq: Two times a day (BID) | INTRAMUSCULAR | Status: DC
  Administered 2023-01-01 – 2023-01-04 (×7): 40 mg via SUBCUTANEOUS
  Filled 2022-12-31 (×7): qty 0.4

## 2022-12-31 MED ORDER — OXYCODONE HCL 5 MG PO TABS
5.0000 mg | ORAL_TABLET | ORAL | Status: DC | PRN
  Administered 2022-12-31 – 2023-01-01 (×5): 10 mg via ORAL
  Filled 2022-12-31 (×5): qty 2

## 2022-12-31 MED ORDER — ACETAMINOPHEN 500 MG PO TABS
1000.0000 mg | ORAL_TABLET | Freq: Four times a day (QID) | ORAL | Status: DC
  Administered 2022-12-31 – 2023-01-05 (×18): 1000 mg via ORAL
  Filled 2022-12-31 (×19): qty 2

## 2022-12-31 MED ORDER — ORAL CARE MOUTH RINSE: 15.0000 mL | OROMUCOSAL | Status: DC | PRN

## 2022-12-31 MED ORDER — METHOCARBAMOL 500 MG PO TABS
1000.0000 mg | ORAL_TABLET | Freq: Three times a day (TID) | ORAL | Status: DC
  Administered 2022-12-31 – 2023-01-04 (×15): 1000 mg via ORAL
  Filled 2022-12-31 (×16): qty 2

## 2022-12-31 MED ORDER — TETANUS-DIPHTH-ACELL PERTUSSIS 5-2.5-18.5 LF-MCG/0.5 IM SUSY
0.5000 mL | PREFILLED_SYRINGE | Freq: Once | INTRAMUSCULAR | Status: AC
  Administered 2022-12-31: 0.5 mL via INTRAMUSCULAR
  Filled 2022-12-31: qty 0.5
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
## 2022-12-31 NOTE — Procedures (Signed)
Extubation Procedure Note  Patient Details:   Name: Bruce Shields DOB: 02-11-1991 MRN: [id number]   Airway Documentation:    Vent end date: 12/31/22 Vent end time: 0807   Evaluation  O2 sats: stable throughout Complications: No apparent complications Patient did tolerate procedure well. Bilateral Breath Sounds: Clear   Yes  Positive cuff leak noted. Patient placed on [place] 2L with humidity, no stridor noted. Patient able to reach 1500 mL using the incentive spirometer.  Forest Becker Glorious Flicker 12/31/2022, 8:15 AM
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
## 2023-01-01 ENCOUNTER — Inpatient Hospital Stay (HOSPITAL_COMMUNITY): Payer: Medicaid Other

## 2023-01-01 LAB — BPAM RBC
Blood Product Expiration Date: 202406262359
Blood Product Expiration Date: 202406272359
Blood Product Expiration Date: 202406272359
Blood Product Expiration Date: 202406272359
Blood Product Expiration Date: 202406272359
Blood Product Expiration Date: 202406272359
Blood Product Expiration Date: 202406272359
Blood Product Expiration Date: 202406292359
ISSUE DATE / TIME: 202406020316
ISSUE DATE / TIME: 202406020724
ISSUE DATE / TIME: 202406020724
ISSUE DATE / TIME: 202406020829
ISSUE DATE / TIME: 202406021035
ISSUE DATE / TIME: 202406021511
Unit Type and Rh: 5100
Unit Type and Rh: 5100
Unit Type and Rh: 5100
Unit Type and Rh: 5100

## 2023-01-01 LAB — TYPE AND SCREEN
Unit division: 0
Unit division: 0
Unit division: 0
Unit division: 0
Unit division: 0

## 2023-01-01 LAB — MISC LABCORP TEST (SEND OUT): Labcorp test code: 83935

## 2023-01-01 LAB — CBC
HCT: 38.7 % — ABNORMAL LOW (ref 39.0–52.0)
Hemoglobin: 13 g/dL (ref 13.0–17.0)
MCH: 31 pg (ref 26.0–34.0)
MCHC: 33.6 g/dL (ref 30.0–36.0)
MCV: 92.4 fL (ref 80.0–100.0)
Platelets: 133 10*3/uL — ABNORMAL LOW (ref 150–400)
RBC: 4.19 MIL/uL — ABNORMAL LOW (ref 4.22–5.81)
RDW: 12.9 % (ref 11.5–15.5)
WBC: 9.2 10*3/uL (ref 4.0–10.5)
nRBC: 0 % (ref 0.0–0.2)

## 2023-01-01 MED ORDER — METHOCARBAMOL 500 MG PO TABS: 1000.0000 mg | ORAL_TABLET | Freq: Three times a day (TID) | ORAL | Status: DC

## 2023-01-01 MED ORDER — KETOROLAC TROMETHAMINE 15 MG/ML IJ SOLN
30.0000 mg | Freq: Four times a day (QID) | INTRAMUSCULAR | Status: DC
  Administered 2023-01-01 – 2023-01-05 (×15): 30 mg via INTRAVENOUS
  Filled 2023-01-01 (×17): qty 2

## 2023-01-01 MED ORDER — BUSPIRONE HCL 5 MG PO TABS
15.0000 mg | ORAL_TABLET | Freq: Three times a day (TID) | ORAL | Status: DC
  Administered 2023-01-01 – 2023-01-04 (×10): 15 mg via ORAL
  Filled 2023-01-01: qty 1
  Filled 2023-01-01 (×11): qty 3

## 2023-01-01 MED ORDER — OXYCODONE HCL 5 MG PO TABS
10.0000 mg | ORAL_TABLET | ORAL | Status: DC | PRN
  Administered 2023-01-01: 15 mg via ORAL
  Administered 2023-01-02: 10 mg via ORAL
  Administered 2023-01-02: 15 mg via ORAL
  Administered 2023-01-03: 10 mg via ORAL
  Administered 2023-01-03 – 2023-01-05 (×6): 15 mg via ORAL
  Filled 2023-01-01: qty 3
  Filled 2023-01-01: qty 2
  Filled 2023-01-01 (×3): qty 3
  Filled 2023-01-01: qty 2
  Filled 2023-01-01 (×3): qty 3
  Filled 2023-01-01: qty 2
  Filled 2023-01-01: qty 3
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
## 2023-01-01 NOTE — Discharge Instructions (Addendum)
[facility]   Discharge Instructions  Endovascular Aortic Aneurysm Repair  Please refer to the following instructions for your post-procedure care. Your surgeon or Physician Assistant will discuss any changes with you.  Activity  You are encouraged to walk as much as you can. You can slowly return to normal activities but must avoid strenuous activity and heavy lifting until your doctor tells you it's OK. Avoid activities such as vacuuming or swinging a gold club. It is normal to feel tired for several weeks after your surgery. Do not drive until your doctor gives the OK and you are no longer taking prescription pain medications. It is also normal to have difficulty with sleep habits, eating, and bowel movements after surgery. These will go away with time.  Bathing/Showering  You may shower after you go home. If you have an incision, do not soak in a bathtub, hot tub, or swim until the incision heals completely.  Incision Care  Shower every day. Clean your incision with mild soap and water. Pat the area dry with a clean towel. You do not need a bandage unless otherwise instructed. Do not apply any ointments or creams to your incision. If you clothing is irritating, you may cover your incision with a dry gauze pad.  Diet  Resume your normal diet. There are no special food restrictions following this procedure. A low fat/low cholesterol diet is recommended for all patients with vascular disease. In order to heal from your surgery, it is CRITICAL to get adequate nutrition. Your body requires vitamins, minerals, and protein. Vegetables are the best source of vitamins and minerals. Vegetables also provide the perfect balance of protein. Processed food has little nutritional value, so try to avoid this.  Medications  Resume taking all of your medications unless your doctor or nurse practitioner tells you not to. If your incision is causing pain, you may take  over-the-counter pain relievers such as acetaminophen (Tylenol). If you were prescribed a stronger pain medication, please be aware these medications can cause nausea and constipation. Prevent nausea by taking the medication with a snack or meal. Avoid constipation by drinking plenty of fluids and eating foods with a high amount of fiber, such as fruits, vegetables, and grains. Do not take Tylenol if you are taking prescription pain medications.   Follow up  Our office will schedule a follow-up appointment with a C.T. scan 3-4 weeks after your surgery.  Please call us immediately for any of the following conditions  Severe or worsening pain in your legs or feet or in your abdomen back or chest. Increased pain, redness, drainage (pus) from your incision sit. Increased abdominal pain, bloating, nausea, vomiting or persistent diarrhea. Fever of 101 degrees or higher. Swelling in your leg (s),  Reduce your risk of vascular disease  Stop smoking. If you would like help call [facility] at 1-800-QUIT-NOW (902-382-8257) or [place] at ([phone]. Manage your cholesterol Maintain a desired weight Control your diabetes Keep your blood pressure down  If you have questions, please call the office at [phone].   PNEUMOTHORAX OR HEMOTHORAX +/- RIB FRACTURES  HOME INSTRUCTIONS   PAIN CONTROL:  Pain is best controlled by a usual combination of three different methods TOGETHER:  Ice/Heat Over the counter pain medication Prescription pain medication You may experience some swelling and bruising in area of broken ribs. Ice packs or heating pads (30-60 minutes up to 6 times a day) will help. Use ice for the first few days  to help decrease swelling and bruising, then switch to heat to help relax tight/sore spots and speed recovery. Some people prefer to use ice alone, heat alone, alternating between ice & heat. Experiment to what works for you. Swelling and bruising can take several weeks to  resolve.  It is helpful to take an over-the-counter pain medication regularly for the first few weeks. Choose one of the following that works best for you:  Naproxen (Aleve, etc) Two 220mg  tabs twice a day Ibuprofen (Advil, etc) Three 200mg  tabs four times a day (every meal & bedtime) Acetaminophen (Tylenol, etc) 500-650mg  four times a day (every meal & bedtime) A prescription for pain medication (such as oxycodone, hydrocodone, etc) may be given to you upon discharge. Take your pain medication as prescribed.  If you are having problems/concerns with the prescription medicine (does not control pain, nausea, vomiting, rash, itching, etc), please call us [phone] to see if we need to switch you to a different pain medicine that will work better for you and/or control your side effect better. If you need a refill on your pain medication, please contact your pharmacy. They will contact our office to request authorization. Prescriptions will not be filled after 5 pm or on week-ends. Avoid getting constipated. When taking pain medications, it is common to experience some constipation. Increasing fluid intake and taking a fiber supplement (such as Metamucil, Citrucel, FiberCon, MiraLax, etc) 1-2 times a day regularly will usually help prevent this problem from occurring. A mild laxative (prune juice, Milk of Magnesia, MiraLax, etc) should be taken according to package directions if there are no bowel movements after 48 hours.  Watch out for diarrhea. If you have many loose bowel movements, simplify your diet to bland foods & liquids for a few days. Stop any stool softeners and decrease your fiber supplement. Switching to mild anti-diarrheal medications (Kayopectate, Pepto Bismol) can help. If this worsens or does not improve, please call us. Chest tube site wound: you may remove the dressing from your chest tube site 3 days after the removal of your chest tube. DO NOT shower over the dressing. Once    removed, you may shower as normal. Do not submerge your wound in water for 2-3 weeks.  FOLLOW UP  Please call our office to set up or confirm an appointment for follow up for 2 weeks after discharge. You will need to get a chest xray at either [facility] or [facility]. This will be outlined in your follow up instructions. Please call [facility] at [phone] if you have any questions about follow up.  If you have any orthopedic or other injuries you will need to follow up as outlined in your follow up instructions.   WHEN TO CALL us [phone]:  Poor pain control Reactions / problems with new medications (rash/itching, nausea, etc)  Fever over 101.5 F (38.5 C) Worsening swelling or bruising Redness, drainage, pain or swelling around chest tube site Worsening pain, productive cough, difficulty breathing or any other concerning symptoms  The clinic staff is available to answer your questions during regular business hours (8:30am-5pm). Please don't hesitate to call and ask to speak to one of our nurses for clinical concerns.  If you have a medical emergency, go to the nearest emergency room or call 911.  A surgeon from [facility] is always on call at the [facility], [place]  [address], [address], [place], [place] [address] ?  MAIN: (  336) [phone] ? TOLL FREE: [phone] ?  FAX [phone]  [email or web address]      Information on Rib Fractures  A rib fracture is a break or crack in one of the bones of the ribs. The ribs are long, curved bones that wrap around your chest and attach to your spine and your breastbone. The ribs protect your heart, lungs, and other organs in the chest. A broken or cracked rib is often painful but is not usually serious. Most rib fractures heal on their own over time. However, rib fractures can be more serious if multiple ribs are broken or if broken ribs move out of place and push  against other structures or organs. What are the causes? This condition is caused by: Repetitive movements with high force, such as pitching a baseball or having severe coughing spells. A direct blow to the chest, such as a sports injury, a car accident, or a fall. Cancer that has spread to the bones, which can weaken bones and cause them to break. What are the signs or symptoms? Symptoms of this condition include: Pain when you breathe in or cough. Pain when someone presses on the injured area. Feeling short of breath. How is this diagnosed? This condition is diagnosed with a physical exam and medical history. Imaging tests may also be done, such as: Chest X-ray. CT scan. MRI. Bone scan. Chest ultrasound. How is this treated? Treatment for this condition depends on the severity of the fracture. Most rib fractures usually heal on their own in 1-3 months. Sometimes healing takes longer if there is a cough that does not stop or if there are other activities that make the injury worse (aggravating factors). While you heal, you will be given medicines to control the pain. You will also be taught deep breathing exercises. Severe injuries may require hospitalization or surgery. Follow these instructions at home: Managing pain, stiffness, and swelling If directed, apply ice to the injured area. Put ice in a plastic bag. Place a towel between your skin and the bag. Leave the ice on for 20 minutes, 2-3 times a day. Take over-the-counter and prescription medicines only as told by your health care provider. Activity Avoid a lot of activity and any activities or movements that cause pain. Be careful during activities and avoid bumping the injured rib. Slowly increase your activity as told by your health care provider. General instructions Do deep breathing exercises as told by your health care provider. This helps prevent pneumonia, which is a common complication of a broken rib. Your health care  provider may instruct you to: Take deep breaths several times a day. Try to cough several times a day, holding a pillow against the injured area. Use a device called incentive spirometer to practice deep breathing several times a day. Drink enough fluid to keep your urine pale yellow. Do not wear a rib belt or binder. These restrict breathing, which can lead to pneumonia. Keep all follow-up visits as told by your health care provider. This is important. Contact a health care provider if: You have a fever. Get help right away if: You have difficulty breathing or you are short of breath. You develop a cough that does not stop, or you cough up thick or bloody sputum. You have nausea, vomiting, or pain in your abdomen. Your pain gets worse and medicine does not help. Summary A rib fracture is a break or crack in one of the bones of the ribs. A broken or cracked rib  is often painful but is not usually serious. Most rib fractures heal on their own over time. Treatment for this condition depends on the severity of the fracture. Avoid a lot of activity and any activities or movements that cause pain. This information is not intended to replace advice given to you by your health care provider. Make sure you discuss any questions you have with your health care provider. Document Released: 07/16/2005 Document Revised: 10/15/2016 Document Reviewed: 10/15/2016 Elsevier Interactive Patient Education  2019 [employer]    Pneumothorax A pneumothorax is commonly called a collapsed lung. It is a condition in which air leaks from a lung and builds up between the thin layer of tissue that covers the lungs (visceral pleura) and the interior wall of the chest cavity (parietal pleura). The air gets trapped outside the lung, between the lung and the chest wall (pleural space). The air takes up space and prevents the lung from fully expanding. This condition sometimes occurs suddenly with no apparent cause.  The buildup of air may be small or large. A small pneumothorax may go away on its own. A large pneumothorax will require treatment and hospitalization. What are the causes? This condition may be caused by: Trauma and injury to the chest wall. Surgery and other medical procedures. A complication of an underlying lung problem, especially chronic obstructive pulmonary disease (COPD) or emphysema. Sometimes the cause of this condition is not known. What increases the risk? You are more likely to develop this condition if: You have an underlying lung problem. You smoke. You are 50-43 years old, male, tall, and underweight. You have a personal or family history of pneumothorax. You have an eating disorder (anorexia nervosa). This condition can also happen quickly, even in people with no history of lung problems. What are the signs or symptoms? Sometimes a pneumothorax will have no symptoms. When symptoms are present, they can include: Chest pain. Shortness of breath. Increased rate of breathing. Bluish color to your lips or skin (cyanosis). How is this diagnosed? This condition may be diagnosed by: A medical history and physical exam. A chest X-ray, chest CT scan, or ultrasound. How is this treated? Treatment depends on how severe your condition is. The goal of treatment is to remove the extra air and allow your lung to expand back to its normal size. For a small pneumothorax: No treatment may be needed. Extra oxygen is sometimes used to make it go away more quickly. For a large pneumothorax or a pneumothorax that is causing symptoms, a procedure is done to drain the air from your lungs. To do this, a health care provider may use: A needle with a syringe. This is used to suck air from a pleural space where no additional leakage is taking place. A chest tube. This is used to suck air where there is ongoing leakage into the pleural space. The chest tube may need to remain in place for several  days until the air leak has healed. In more severe cases, surgery may be needed to repair the damage that is causing the leak. If you have multiple pneumothorax episodes or have an air leak that will not heal, a procedure called a pleurodesis may be done. A medicine is placed in the pleural space to irritate the tissues around the lung so that the lung will stick to the chest wall, seal any leaks, and stop any buildup of air in that space. If you have an underlying lung problem, severe symptoms, or a large  pneumothorax you will usually need to stay in the hospital. Follow these instructions at home: Lifestyle Do not use any products that contain nicotine or tobacco, such as cigarettes and e-cigarettes. These are major risk factors in pneumothorax. If you need help quitting, ask your health care provider. Do not lift anything that is heavier than 10 lb (4.5 kg), or the limit that your health care provider tells you, until he or she says that it is safe. Avoid activities that take a lot of effort (strenuous) for as long as told by your health care provider. Return to your normal activities as told by your health care provider. Ask your health care provider what activities are safe for you. Do not fly in an airplane or scuba dive until your health care provider says it is okay. General instructions Take over-the-counter and prescription medicines only as told by your health care provider. If a cough or pain makes it difficult for you to sleep at night, try sleeping in a semi-upright position in a recliner or by using 2 or 3 pillows. If you had a chest tube and it was removed, ask your health care provider when you can remove the bandage (dressing). While the dressing is in place, do not allow it to get wet. Keep all follow-up visits as told by your health care provider. This is important. Contact a health care provider if: You cough up thick mucus (sputum) that is yellow or green in color. You were  treated with a chest tube, and you have redness, increasing pain, or discharge at the site where it was placed. Get help right away if: You have increasing chest pain or shortness of breath. You have a cough that will not go away. You begin coughing up blood. You have pain that is getting worse or is not controlled with medicines. The site where your chest tube was located opens up. You feel air coming out of the site where the chest tube was placed. You have a fever or persistent symptoms for more than 2-3 days. You have a fever and your symptoms suddenly get worse. These symptoms may represent a serious problem that is an emergency. Do not wait to see if the symptoms will go away. Get medical help right away. Call your local emergency services (911 in the [place] Do not drive yourself to the hospital. Summary A pneumothorax, commonly called a collapsed lung, is a condition in which air leaks from a lung and gets trapped between the lung and the chest wall (pleural space). The buildup of air may be small or large. A small pneumothorax may go away on its own. A large pneumothorax will require treatment and hospitalization. Treatment for this condition depends on how severe the pneumothorax is. The goal of treatment is to remove the extra air and allow the lung to expand back to its normal size. This information is not intended to replace advice given to you by your health care provider. Make sure you discuss any questions you have with your health care provider. Document Released: 07/16/2005 Document Revised: 06/24/2017 Document Reviewed: 06/24/2017 Elsevier Interactive Patient Education  2019 [employer].
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
## 2023-01-02 ENCOUNTER — Inpatient Hospital Stay (HOSPITAL_COMMUNITY): Payer: Medicaid Other

## 2023-01-02 DIAGNOSIS — Z9889 Other specified postprocedural states: Secondary | ICD-10-CM | POA: Diagnosis not present

## 2023-01-02 MED ORDER — HYDROXYZINE HCL 25 MG PO TABS
25.0000 mg | ORAL_TABLET | Freq: Three times a day (TID) | ORAL | Status: DC
  Administered 2023-01-02 (×2): 25 mg via ORAL
  Filled 2023-01-02 (×3): qty 1

## 2023-01-02 MED ORDER — THIAMINE HCL 100 MG/ML IJ SOLN: 100.0000 mg | Freq: Every day | INTRAMUSCULAR | Status: DC

## 2023-01-02 MED ORDER — ADULT MULTIVITAMIN W/MINERALS CH
1.0000 | ORAL_TABLET | Freq: Every day | ORAL | Status: DC
  Administered 2023-01-02 – 2023-01-05 (×4): 1 via ORAL
  Filled 2023-01-02 (×4): qty 1

## 2023-01-02 MED ORDER — MORPHINE SULFATE (PF) 2 MG/ML IV SOLN: 2.0000 mg | INTRAVENOUS | Status: DC | PRN

## 2023-01-02 MED ORDER — THIAMINE MONONITRATE 100 MG PO TABS
100.0000 mg | ORAL_TABLET | Freq: Every day | ORAL | Status: DC
  Administered 2023-01-02 – 2023-01-05 (×4): 100 mg via ORAL
  Filled 2023-01-02 (×4): qty 1

## 2023-01-02 MED ORDER — FOLIC ACID 1 MG PO TABS
1.0000 mg | ORAL_TABLET | Freq: Every day | ORAL | Status: DC
  Administered 2023-01-02 – 2023-01-05 (×4): 1 mg via ORAL
  Filled 2023-01-02 (×4): qty 1

## 2023-01-02 MED ORDER — POLYETHYLENE GLYCOL 3350 17 G PO PACK
17.0000 g | PACK | Freq: Two times a day (BID) | ORAL | Status: DC
  Administered 2023-01-02 – 2023-01-04 (×2): 17 g via ORAL
  Filled 2023-01-02 (×3): qty 1
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
## 2023-01-03 ENCOUNTER — Inpatient Hospital Stay (HOSPITAL_COMMUNITY): Payer: Medicaid Other

## 2023-01-03 DIAGNOSIS — Z9889 Other specified postprocedural states: Secondary | ICD-10-CM | POA: Diagnosis not present

## 2023-01-03 LAB — RAPID URINE DRUG SCREEN, HOSP PERFORMED
Amphetamines: NOT DETECTED
Barbiturates: NOT DETECTED
Benzodiazepines: NOT DETECTED
Cocaine: NOT DETECTED
Opiates: POSITIVE — AB
Tetrahydrocannabinol: NOT DETECTED

## 2023-01-03 MED ORDER — HYDROXYZINE HCL 25 MG PO TABS: 25.0000 mg | ORAL_TABLET | Freq: Three times a day (TID) | ORAL | Status: DC | PRN

## 2023-01-03 MED ORDER — FUROSEMIDE 10 MG/ML IJ SOLN
20.0000 mg | Freq: Once | INTRAMUSCULAR | Status: AC
  Administered 2023-01-03: 20 mg via INTRAVENOUS
  Filled 2023-01-03: qty 2

## 2023-01-03 MED ORDER — ORAL CARE MOUTH RINSE: 15.0000 mL | OROMUCOSAL | Status: DC | PRN

## 2023-01-03 MED ORDER — DIPHENHYDRAMINE HCL 25 MG PO CAPS
50.0000 mg | ORAL_CAPSULE | Freq: Every day | ORAL | Status: DC
  Administered 2023-01-03: 50 mg via ORAL
  Filled 2023-01-03 (×2): qty 2
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
## 2023-01-04 ENCOUNTER — Inpatient Hospital Stay (HOSPITAL_COMMUNITY): Payer: Medicaid Other

## 2023-01-04 ENCOUNTER — Other Ambulatory Visit (HOSPITAL_COMMUNITY): Payer: Self-pay

## 2023-01-04 DIAGNOSIS — G4700 Insomnia, unspecified: Secondary | ICD-10-CM

## 2023-01-04 DIAGNOSIS — F419 Anxiety disorder, unspecified: Secondary | ICD-10-CM

## 2023-01-04 DIAGNOSIS — Z9889 Other specified postprocedural states: Secondary | ICD-10-CM

## 2023-01-04 MED ORDER — HYDROXYZINE HCL 25 MG PO TABS
25.0000 mg | ORAL_TABLET | Freq: Three times a day (TID) | ORAL | 0 refills | Status: DC | PRN
  Filled 2023-01-04: qty 10, 4d supply, fill #0

## 2023-01-04 MED ORDER — OXYCODONE HCL 10 MG PO TABS
10.0000 mg | ORAL_TABLET | Freq: Four times a day (QID) | ORAL | 0 refills | Status: DC | PRN
  Filled 2023-01-04: qty 25, 5d supply, fill #0

## 2023-01-04 MED ORDER — FOLIC ACID 1 MG PO TABS: 1.0000 mg | ORAL_TABLET | Freq: Every day | ORAL | Status: DC

## 2023-01-04 MED ORDER — POLYETHYLENE GLYCOL 3350 17 G PO PACK: 17.0000 g | PACK | Freq: Every day | ORAL | 0 refills | Status: DC | PRN

## 2023-01-04 MED ORDER — ASPIRIN 325 MG PO TABS
325.0000 mg | ORAL_TABLET | Freq: Every day | ORAL | 0 refills | Status: DC
  Filled 2023-01-04: qty 100, 100d supply, fill #0

## 2023-01-04 MED ORDER — ADULT MULTIVITAMIN W/MINERALS CH: 1.0000 | ORAL_TABLET | Freq: Every day | ORAL | 0 refills | Status: DC

## 2023-01-04 MED ORDER — DOCUSATE SODIUM 100 MG PO CAPS: 100.0000 mg | ORAL_CAPSULE | Freq: Two times a day (BID) | ORAL | 0 refills | Status: DC | PRN

## 2023-01-04 MED ORDER — VITAMIN B-1 100 MG PO TABS: 100.0000 mg | ORAL_TABLET | Freq: Every day | ORAL | Status: DC

## 2023-01-04 MED ORDER — BUSPIRONE HCL 15 MG PO TABS
15.0000 mg | ORAL_TABLET | Freq: Three times a day (TID) | ORAL | 0 refills | Status: DC
  Filled 2023-01-04: qty 90, 30d supply, fill #0

## 2023-01-04 MED ORDER — METHOCARBAMOL 500 MG PO TABS
1000.0000 mg | ORAL_TABLET | Freq: Three times a day (TID) | ORAL | 0 refills | Status: DC | PRN
  Filled 2023-01-04: qty 60, 10d supply, fill #0

## 2023-01-04 MED ORDER — ACETAMINOPHEN 500 MG PO TABS: 1000.0000 mg | ORAL_TABLET | Freq: Four times a day (QID) | ORAL | 0 refills | Status: DC

## 2023-01-04 NOTE — Progress Notes (Signed)
OT Cancellation Note  Patient Details Name: Bruce Shields MRN: [id number] DOB: 1990-10-27   Cancelled Treatment:    Reason Eval/Treat Not Completed: Patient declined, no reason specified (Pt stated he is okay with not doing therapy today, he feels as though he has all the resources he needs including mental health. Reinforced spirometer use.)  01/04/2023  AB, OTR/L  Acute Rehabilitation Services  Office: [phone]   Tristan Schroeder 01/04/2023, 5:02 PM

## 2023-01-04 NOTE — Progress Notes (Signed)
Trauma Event Note    Left chest tube removed per order- tolerated procedure well. Using IS as directed post removal.   Father at bedside.  Last imported Vital Signs BP (!) 167/94 (BP Location: Right Arm)   Pulse 78   Temp 98.1 F (36.7 C) (Oral)   Resp 17   Ht 6' (1.829 m)   Wt 220 lb 7.4 oz (100 kg)   SpO2 98%   BMI 29.90 kg/m   Trending CBC No results for input(s): "WBC", "HGB", "HCT", "PLT" in the last 72 hours.  Trending Coag's No results for input(s): "APTT", "INR" in the last 72 hours.  Trending BMET No results for input(s): "NA", "K", "CL", "CO2", "BUN", "CREATININE", "GLUCOSE" in the last 72 hours.    Bruce Shields  Trauma Response RN  Please call TRN at [phone] for further assistance.
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
## 2023-01-07 ENCOUNTER — Telehealth: Payer: Self-pay | Admitting: Physician Assistant

## 2023-01-07 ENCOUNTER — Other Ambulatory Visit: Payer: Self-pay

## 2023-01-07 ENCOUNTER — Encounter: Payer: Self-pay | Admitting: Family Medicine

## 2023-01-07 DIAGNOSIS — W3400XA Accidental discharge from unspecified firearms or gun, initial encounter: Secondary | ICD-10-CM

## 2023-01-07 DIAGNOSIS — S21309S Unspecified open wound of unspecified front wall of thorax with penetration into thoracic cavity, sequela: Secondary | ICD-10-CM
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
## 2023-01-07 NOTE — Telephone Encounter (Signed)
-----   Message from Emilie Rutter, PA-C sent at 01/01/2023  8:05 AM EDT -----  3-4 weeks with CTA chest, abd, pelvis with Dr. Chestine Spore.  PO TEVAR and R CFA endart Thanks

## 2023-01-08 NOTE — Telephone Encounter (Signed)
Appt has been scheduled.

## 2023-01-08 NOTE — Addendum Note (Signed)
Addended by: Yolonda Kida on: 01/08/2023 12:47 PM   Modules accepted: Orders

## 2023-01-22 ENCOUNTER — Encounter (HOSPITAL_COMMUNITY): Payer: Self-pay | Admitting: *Deleted

## 2023-01-22 ENCOUNTER — Ambulatory Visit (INDEPENDENT_AMBULATORY_CARE_PROVIDER_SITE_OTHER): Payer: Medicaid Other

## 2023-01-22 ENCOUNTER — Telehealth: Payer: Self-pay | Admitting: Vascular Surgery

## 2023-01-22 ENCOUNTER — Ambulatory Visit (HOSPITAL_COMMUNITY)
Admission: EM | Admit: 2023-01-22 | Discharge: 2023-01-22 | Disposition: A | Discharge status: Home / Self Care | Payer: Medicaid Other | Attending: Emergency Medicine | Admitting: Emergency Medicine

## 2023-01-22 DIAGNOSIS — M20022 Boutonniere deformity of left finger(s): Secondary | ICD-10-CM

## 2023-01-22 DIAGNOSIS — S6992XA Unspecified injury of left wrist, hand and finger(s), initial encounter: Secondary | ICD-10-CM

## 2023-01-22 NOTE — Discharge Instructions (Signed)
Please call the orthopedic clinic to make an appointment. You likely have a soft tissue injury (such as ligament or tendon) that needs to be evaluated by a specialist.   Use ibuprofen and tylenol as needed for pain in the meantime.
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
## 2023-01-22 NOTE — ED Triage Notes (Signed)
Pt states he was playing basketball with his son a few weeks ago and he jammed his left ring finger.

## 2023-02-12 ENCOUNTER — Encounter: Payer: Self-pay | Admitting: Vascular Surgery

## 2023-02-19 ENCOUNTER — Telehealth: Payer: Self-pay | Admitting: Vascular Surgery

## 2023-02-19 NOTE — Telephone Encounter (Signed)
We have made several attempts to get patient scheduled for CTA and MD visit.

## 2024-01-10 ENCOUNTER — Ambulatory Visit (HOSPITAL_COMMUNITY)
Admission: EM | Admit: 2024-01-10 | Discharge: 2024-01-10 | Disposition: A | Discharge status: Home / Self Care | Attending: Family Medicine | Admitting: Family Medicine

## 2024-01-10 ENCOUNTER — Encounter (HOSPITAL_COMMUNITY): Payer: Self-pay

## 2024-01-10 DIAGNOSIS — Z113 Encounter for screening for infections with a predominantly sexual mode of transmission: Secondary | ICD-10-CM | POA: Diagnosis present

## 2024-01-10 LAB — HIV ANTIBODY (ROUTINE TESTING W REFLEX): HIV Screen 4th Generation wRfx: NONREACTIVE
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
## 2024-01-10 NOTE — ED Triage Notes (Signed)
 Patient presenting for routine testing. Denies current symptoms or known exposure. Requesting blood work be done as well.

## 2024-01-10 NOTE — Discharge Instructions (Addendum)
 Test results will be released to your MyChart account We will contact you if anything is positive and requires treatment.

## 2024-01-11 LAB — RPR: RPR Ser Ql: NONREACTIVE

## 2024-01-13 LAB — CYTOLOGY, (ORAL, ANAL, URETHRAL) ANCILLARY ONLY
Chlamydia: NEGATIVE
Comment: NEGATIVE
Comment: NEGATIVE
Comment: NORMAL
Neisseria Gonorrhea: NEGATIVE
Trichomonas: NEGATIVE
